# Patient Record
Sex: Female | Born: 1935 | Race: White | Hispanic: No | Smoking: Never smoker
Health system: Southern US, Community
[De-identification: ages and names within clinical notes are randomized; demographics above are authoritative.]

## PROBLEM LIST (undated history)

## (undated) DIAGNOSIS — M810 Age-related osteoporosis without current pathological fracture: Secondary | ICD-10-CM

## (undated) DIAGNOSIS — I10 Essential (primary) hypertension: Secondary | ICD-10-CM

## (undated) DIAGNOSIS — I509 Heart failure, unspecified: Secondary | ICD-10-CM

## (undated) DIAGNOSIS — M1990 Unspecified osteoarthritis, unspecified site: Secondary | ICD-10-CM

## (undated) DIAGNOSIS — F79 Unspecified intellectual disabilities: Secondary | ICD-10-CM

---

## 2003-05-21 ENCOUNTER — Encounter: Admission: RE | Admit: 2003-05-21 | Discharge: 2003-05-21 | Payer: Self-pay | Admitting: Internal Medicine

## 2010-06-09 ENCOUNTER — Emergency Department (HOSPITAL_COMMUNITY): Payer: Medicare Other

## 2010-06-09 ENCOUNTER — Observation Stay (HOSPITAL_COMMUNITY)
Admission: EM | Admit: 2010-06-09 | Discharge: 2010-06-11 | Disposition: A | Discharge status: Home / Self Care | Payer: Medicare Other | Attending: Internal Medicine | Admitting: Internal Medicine

## 2010-06-09 DIAGNOSIS — M810 Age-related osteoporosis without current pathological fracture: Secondary | ICD-10-CM | POA: Insufficient documentation

## 2010-06-09 DIAGNOSIS — I059 Rheumatic mitral valve disease, unspecified: Secondary | ICD-10-CM | POA: Insufficient documentation

## 2010-06-09 DIAGNOSIS — M1990 Unspecified osteoarthritis, unspecified site: Secondary | ICD-10-CM | POA: Insufficient documentation

## 2010-06-09 DIAGNOSIS — F70 Mild intellectual disabilities: Secondary | ICD-10-CM | POA: Insufficient documentation

## 2010-06-09 DIAGNOSIS — I517 Cardiomegaly: Secondary | ICD-10-CM | POA: Insufficient documentation

## 2010-06-09 DIAGNOSIS — R82998 Other abnormal findings in urine: Secondary | ICD-10-CM | POA: Insufficient documentation

## 2010-06-09 DIAGNOSIS — M7989 Other specified soft tissue disorders: Secondary | ICD-10-CM | POA: Insufficient documentation

## 2010-06-09 DIAGNOSIS — L03119 Cellulitis of unspecified part of limb: Secondary | ICD-10-CM | POA: Insufficient documentation

## 2010-06-09 DIAGNOSIS — R0602 Shortness of breath: Secondary | ICD-10-CM | POA: Insufficient documentation

## 2010-06-09 DIAGNOSIS — E876 Hypokalemia: Secondary | ICD-10-CM | POA: Insufficient documentation

## 2010-06-09 DIAGNOSIS — I10 Essential (primary) hypertension: Secondary | ICD-10-CM | POA: Insufficient documentation

## 2010-06-09 DIAGNOSIS — Z79899 Other long term (current) drug therapy: Secondary | ICD-10-CM | POA: Insufficient documentation

## 2010-06-09 DIAGNOSIS — L02219 Cutaneous abscess of trunk, unspecified: Secondary | ICD-10-CM | POA: Insufficient documentation

## 2010-06-09 DIAGNOSIS — L02419 Cutaneous abscess of limb, unspecified: Principal | ICD-10-CM | POA: Insufficient documentation

## 2010-06-09 LAB — DIFFERENTIAL
Basophils Relative: 1 % (ref 0–1)
Eosinophils Absolute: 0.1 10*3/uL (ref 0.0–0.7)
Monocytes Absolute: 0.7 10*3/uL (ref 0.1–1.0)
Monocytes Relative: 10 % (ref 3–12)
Neutrophils Relative %: 56 % (ref 43–77)

## 2010-06-09 LAB — COMPREHENSIVE METABOLIC PANEL
Alkaline Phosphatase: 64 U/L (ref 39–117)
BUN: 26 mg/dL — ABNORMAL HIGH (ref 6–23)
CO2: 27 mEq/L (ref 19–32)
Chloride: 101 mEq/L (ref 96–112)
Glucose, Bld: 90 mg/dL (ref 70–99)
Potassium: 3.5 mEq/L (ref 3.5–5.1)
Total Bilirubin: 0.7 mg/dL (ref 0.3–1.2)

## 2010-06-09 LAB — CBC
MCH: 31.2 pg (ref 26.0–34.0)
MCHC: 33.7 g/dL (ref 30.0–36.0)
Platelets: 263 10*3/uL (ref 150–400)

## 2010-06-09 LAB — CK TOTAL AND CKMB (NOT AT ARMC): Relative Index: INVALID (ref 0.0–2.5)

## 2010-06-10 ENCOUNTER — Observation Stay (HOSPITAL_COMMUNITY): Payer: Medicare Other

## 2010-06-10 DIAGNOSIS — I059 Rheumatic mitral valve disease, unspecified: Secondary | ICD-10-CM

## 2010-06-10 LAB — MRSA PCR SCREENING: MRSA by PCR: NEGATIVE

## 2010-06-10 LAB — MAGNESIUM: Magnesium: 2.3 mg/dL (ref 1.5–2.5)

## 2010-06-10 LAB — URINALYSIS, ROUTINE W REFLEX MICROSCOPIC
Ketones, ur: NEGATIVE mg/dL
Nitrite: NEGATIVE
Protein, ur: NEGATIVE mg/dL
Urobilinogen, UA: 0.2 mg/dL (ref 0.0–1.0)
pH: 6 (ref 5.0–8.0)

## 2010-06-10 LAB — CBC
MCH: 31 pg (ref 26.0–34.0)
Platelets: 240 10*3/uL (ref 150–400)
RBC: 4.03 MIL/uL (ref 3.87–5.11)
WBC: 5.2 10*3/uL (ref 4.0–10.5)

## 2010-06-10 LAB — BASIC METABOLIC PANEL
Chloride: 103 mEq/L (ref 96–112)
Creatinine, Ser: 0.78 mg/dL (ref 0.4–1.2)
GFR calc Af Amer: >60 mL/min (ref >60)
GFR calc non Af Amer: >60 mL/min (ref >60)

## 2010-06-10 LAB — DIFFERENTIAL
Basophils Relative: 1 % (ref 0–1)
Eosinophils Absolute: 0.2 10*3/uL (ref 0.0–0.7)
Monocytes Relative: 12 % (ref 3–12)
Neutrophils Relative %: 50 % (ref 43–77)

## 2010-06-10 LAB — GLUCOSE, CAPILLARY

## 2010-06-11 LAB — URINE CULTURE
Colony Count: NO GROWTH
Culture  Setup Time: 201205311820

## 2010-06-13 NOTE — H&P (Signed)
NAMESERRA, Lisa [name]               ACCOUNT NO.:  [id number]  MEDICAL RECORD NO.:  [id number]           PATIENT TYPE:  O  LOCATION:  A325                          FACILITY:  [facility]  PHYSICIAN:  Vania Rea, M.D. DATE OF BIRTH:  [birth year]  DATE OF ADMISSION:  06/09/2010 DATE OF DISCHARGE:  LH                             HISTORY & PHYSICAL   PRIMARY CARE PHYSICIAN:  Dr. Virgina Organ.  CHIEF COMPLAINT:  Swelling and redness of the leg since this morning.  HISTORY OF PRESENT ILLNESS:  This is an 77 year old Caucasian lady with a history of mental retardation, who resides at [facility] in [place], who usually bathes and dresses herself without assistance and typically wears pants, but who is occasionally inspected by the team at the Group Home to make sure everything is okay.  The patient reports that she has been having swelling and pain in her legs since this morning.  People in the Group Home said they think her legs have been swelling since last night, but did not become red and painful until this morning.  The patient does take Lasix daily for over a year now for lower extremity swelling, but does not have a history of CHF.  The swelling and pain in her feet were not associated with shortness of breath nor fever.  She has been having no nausea nor vomiting.  She has taken no over-the-counter medications and denies any itching of her skin.  The attendance at the Group Home are not aware of any periumbilical problems and the patient herself is not able to give any history in regard to periumbilical problem.  Other than this, history of presenting complaint is compromised by the patient's mental abnormality, and she refers all questions to her caregivers.  There is no history of trauma to the legs.  PAST MEDICAL HISTORY:  Hypertension, osteoarthritis, osteoporosis, mental retardation.  MEDICATIONS:  Include: 1. Furosemide 40 mg daily. 2. Amlodipine/benazepril 5/20 one  daily. 3. Alendronate 70 mg each Thursday. 4. Metoprolol tartrate 25 mg twice daily. 5. Calcium with vitamin D three times daily. 6. Tylenol Extra Strength 1 gram every 6 hours as needed for arthritic     pains. 7. Benefiber sugar free powder every day for constipation. 8. The patient also get an injection in her left knee every 6 weeks     from Dr. Hilda Lias for arthritis.  ALLERGIES:  To ADVIL, NAPROSYN, and ASPIRIN.  SOCIAL HISTORY:  No tobacco, alcohol, or illicit drug use.  Lives in a group home.  FAMILY HISTORY:  Unknown.  REVIEW OF SYSTEMS:  Other than noted above unremarkable.  PHYSICAL EXAM:  GENERAL:  Pleasant, elderly Caucasian lady, actually looks younger than her stated age. VITAL SIGNS:  Temperature is 99.9, pulse 82, respirations 20, blood pressure 154/72.  She is saturating 95% on room air. HEENT:  Her pupils are round and equal.  Mucous membranes pink. Anicteric.  No cervical lymphadenopathy.  No thyromegaly or carotid bruit. CHEST:  Clear to auscultation bilaterally. CARDIOVASCULAR SYSTEM:  Regular rhythm.  No murmur. ABDOMEN:  Soft, nontender.  No masses. LOWER EXTREMITIES:  She has 1+  soft pitting edema of both legs, arthritic deformity of the knees and ankles.  She has varicose veins in both feet and around the ankles. SKIN:  The skin is erythematous and warm in both legs and above the knees to the lower third of both thighs.  The skin of both buttocks was also red and blanches easily, but there is no ulcerations.  She has inflammation around the navel with a clear discharge oozing from the navel.  The inflammation and redness around the navel extends to an area about 3 cm circumference.  She has no other visible rash on her skin. She has no observed breakages of the skin of the extremities. CENTRAL NERVOUS SYSTEM:  Cranial nerves II-XII are grossly intact.  She has no focal lateralizing signs.  Her white count is 6.5, hemoglobin 13.4, platelets 263.  She  has normal differential.  Her eosinophil count is normal at 100.  Sodium is 138, potassium 3.5, chloride 101, CO2 27, glucose 90, BUN elevated at 26, creatinine 1.03.  Her liver functions are completely normal.  Her calcium is 10.3.  Her pro beta-type natriuretic peptide is just slightly elevated at 505.  Her cardiac enzymes are completely normal with undetectable troponins and CK of 56.  Her two-view chest x-ray shows slight COPD with mild linear basilar atelectasis, otherwise, no active cardiopulmonary disease.  ASSESSMENT: 1. Acute redness and swelling of the lower extremity.  Differential     includes acute early cellulitis versus an acute allergy. 2. Periumbilical rash, probably eczema. 3. Hypertension. 4. Osteoarthritis. 5. Mental retardation. 6. History of osteoporosis.  PLAN: 1. We will empirically start antibiotics.  We will choose Rocephin for     the time being.  The patient is rapidly spreading bacteria more     likely to be penicillin sensitive.  We will also start empiric     Benadryl for the next 10-24 hours to see if there is a rapid     response, but we will not give steroids. 2. Suspect her leg edema for which she chronically takes Lasix, is     related to her use of amlodipine.  We will not     give additional fluids for the time being, but we will hold her     Lasix.  I doubt that she has heart failure, but we will consider     empirically 2-D echo.  We will continue her other blood pressure     medications. 3. Other plans as per orders.     Vania Rea, M.D.     LC/MEDQ  D:  06/10/2010  T:  06/10/2010  Job:  [id number]  cc:   Colon Branch, MD Fax: [phone]  Electronically Signed by Vania Rea M.D. on 06/13/2010 04:11:29 AM

## 2010-06-13 NOTE — Discharge Summary (Signed)
NAMEHALLIE, [name]               ACCOUNT NO.:  [id number]  MEDICAL RECORD NO.:  [id number]           PATIENT TYPE:  O  LOCATION:  A325                          FACILITY:  [facility]  PHYSICIAN:  Elliot Cousin, M.D.    DATE OF BIRTH:  1923/05/26  DATE OF ADMISSION:  06/09/2010 DATE OF DISCHARGE:  06/01/2012LH                         DISCHARGE SUMMARY-REFERRING   DISCHARGE DIAGNOSES: 1. Bilateral lower extremity cellulitis with associated edema. 2. Periumbilical cellulitis. 3. Diastolic dysfunction per 2-D echocardiogram on Jun 10, 2010. 4. Osteoporosis. 5. Hypertension. 6. Degenerative joint disease. 7. Pyuria with no evidence of dysuria. 8. Mild mental retardation. 9. Borderline hypokalemia.  DISCHARGE MEDICATIONS: 1. Ceftin 500 mg b.i.d. for five more days. 2. Potassium chloride 20 mEq half a tablet b.i.d. 3. Alendronate 70 mg weekly on Thursdays. 4. Amlodipine/benazepril 5/20 mg daily. 5. Benefiber powder 1 packet daily. 6. Calcium carbonate with vitamin D one tablet three times daily. 7. Depo-Medrol 40 mg injection one injection in both knees every 6     weeks. 8. Furosemide 40 mg daily. 9. Metoprolol tartrate 50 mg half a tablet b.i.d. 10.Tylenol Extra Strength 500 mg two tablets every 6 hours as needed     for pain.  DISCHARGE DISPOSITION:  The patient was discharged to home in improved and stable condition on June 11, 2010.  She will follow up with her primary care physician, Dr. Virgina Organ in 1-2 weeks.  CONSULTATIONS:  None.  PROCEDURES PERFORMED: 1. Bilateral lower extremity venous duplex ultrasound on Jun 10, 2010.     The results revealed no evidence of deep vein thrombosis.  Limited     evaluation of calf veins. 2. A 2-D echocardiogram on Jun 10, 2010.  The results revealed     moderate focal basal hypertrophy of the left ventricle.  Systolic     function was vigorous.  Ejection fraction within the range of 75-     80%.  Wall motion was normal.  Doppler  parameters consistent with     grade 1 diastolic dysfunction.  Mild mitral valve regurgitation.     The left atrium was mildly to moderately dilated.  Trivial     tricuspid valve regurgitation.  Pulmonary artery peak pressure 30     mmHg.  HISTORY OF PRESENTING ILLNESS:  The patient is an 77 year old woman with a past medical history significant for mental retardation, osteoporosis, and hypertension, who presented from [facility] in [place], [place] with a chief complaint of swelling and redness of both legs.  When she was initially evaluated, she was noted to be afebrile and hemodynamically stable.  Her chest x-ray revealed slight COPD with mild linear bibasilar atelectasis, otherwise no active cardiopulmonary disease.  Her white blood cell count was within normal limits at 6.5. Her BNP was slightly elevated at 505.  She was admitted for further evaluation and management.  HOSPITAL COURSE:  The patient was continued on Lasix due to the bilateral lower extremity edema.  Rocephin was started empirically for treatment of cellulitis of both legs and at the periumbilical site. Lotrimin cream was added to the periumbilical site twice daily.  Most, if  not all of her other medications were continued.  Potassium chloride was added for borderline hypokalemia.  Her magnesium level was assessed. It was within normal limits at 2.3.  Her thyroid function was assessed. It was within normal limits at 2.33.  Her urinalysis revealed 3-6 wbc's and rare bacteria.  She had no complaints of pain with urination, although there was obvious mild pyuria.  The following day, Rocephin was discontinued in favor of Teflaro. Following the initiation of Teflaro, the cellulitis of her legs completely resolved.  The edema subsided substantially.  She still did have a trace of pedal edema at the time of discharge.  The patient herself voiced that there was significant improvement.  The periumbilical  area is still a little erythematous.  This area could be an area of eczema.  She was not sent home on a topical steroid or antifungal medication.  Rather, she needs to follow up with her primary care physician for reassessment on oral antibiotics only.  For further evaluation, a 2-D echocardiogram was ordered to rule out underlying congestive heart failure and bilateral lower extremity venous ultrasound was ordered to rule out DVT.  The 2-D echocardiogram revealed preserved LV function, although there was grade 1 diastolic dysfunction. The bilateral lower extremity venous ultrasound revealed no obvious DVT. As stated above, the edema in both of her legs decreased substantially.  The patient was discharged to home on five more days of Ceftin.  She was advised to keep her legs elevated at rest.  This was also discussed with [name] at the group home.  Ms. Milus Banister will make an appointment for the patient to follow up with Dr. Virgina Organ in 1-2 weeks for hospital followup.     Elliot Cousin, M.D.     DF/MEDQ  D:  06/11/2010  T:  06/11/2010  Job:  [id number]  cc:   Colon Branch, MD Fax: [phone]  Electronically Signed by Elliot Cousin M.D. on 06/13/2010 05:13:58 PM

## 2011-02-10 ENCOUNTER — Other Ambulatory Visit (HOSPITAL_COMMUNITY)
Admission: RE | Admit: 2011-02-10 | Discharge: 2011-02-10 | Disposition: A | Discharge status: Home / Self Care | Payer: Medicare Other | Source: Ambulatory Visit | Attending: Obstetrics & Gynecology | Admitting: Obstetrics & Gynecology

## 2011-02-10 ENCOUNTER — Other Ambulatory Visit: Payer: Self-pay | Admitting: Obstetrics & Gynecology

## 2011-02-10 DIAGNOSIS — Z124 Encounter for screening for malignant neoplasm of cervix: Secondary | ICD-10-CM | POA: Insufficient documentation

## 2011-09-13 ENCOUNTER — Ambulatory Visit: Payer: Medicare Other | Attending: Internal Medicine | Admitting: Rehabilitative and Restorative Service Providers"

## 2011-09-13 DIAGNOSIS — IMO0001 Reserved for inherently not codable concepts without codable children: Secondary | ICD-10-CM | POA: Insufficient documentation

## 2011-09-13 DIAGNOSIS — R269 Unspecified abnormalities of gait and mobility: Secondary | ICD-10-CM | POA: Insufficient documentation

## 2012-05-30 ENCOUNTER — Ambulatory Visit: Payer: Medicare Other | Attending: Internal Medicine | Admitting: Occupational Therapy

## 2012-05-30 DIAGNOSIS — IMO0001 Reserved for inherently not codable concepts without codable children: Secondary | ICD-10-CM | POA: Insufficient documentation

## 2012-05-30 DIAGNOSIS — R269 Unspecified abnormalities of gait and mobility: Secondary | ICD-10-CM | POA: Insufficient documentation

## 2012-06-15 ENCOUNTER — Ambulatory Visit: Payer: Medicare Other | Admitting: Rehabilitative and Restorative Service Providers"

## 2012-06-22 ENCOUNTER — Ambulatory Visit: Payer: Medicare Other | Attending: Internal Medicine | Admitting: Physical Therapy

## 2012-06-22 DIAGNOSIS — IMO0001 Reserved for inherently not codable concepts without codable children: Secondary | ICD-10-CM | POA: Insufficient documentation

## 2012-06-22 DIAGNOSIS — R269 Unspecified abnormalities of gait and mobility: Secondary | ICD-10-CM | POA: Insufficient documentation

## 2015-04-08 ENCOUNTER — Ambulatory Visit: Payer: Medicare Other | Admitting: Orthopaedic Surgery

## 2015-04-08 ENCOUNTER — Encounter: Payer: Self-pay | Admitting: Orthopaedic Surgery

## 2015-04-14 ENCOUNTER — Ambulatory Visit (INDEPENDENT_AMBULATORY_CARE_PROVIDER_SITE_OTHER): Payer: Medicare Other | Admitting: Orthopaedic Surgery

## 2015-04-14 DIAGNOSIS — M25562 Pain in left knee: Secondary | ICD-10-CM

## 2015-04-14 DIAGNOSIS — M25561 Pain in right knee: Secondary | ICD-10-CM

## 2015-04-14 NOTE — Progress Notes (Signed)
CC:  I have pain of my right and my left knee. I would like an injection.  The patient has chronic pain of the left and right knee.  There is no recent trauma.  There is no redness.  Injections in the past have helped.  The knee has no redness, has an effusion and crepitus present.  ROM of the knee is 0-100 right, 0-105 left.  Impression:  Chronic knee pain right and left knees  Return: 2 months.  PROCEDURE NOTE:  The patient request injection, verbal consent was obtained.  The bilateral knees were prepped appropriately after time out was performed.   Sterile technique was observed and injection of 1 cc of Depo-Medrol 40 mg with several cc's of plain xylocaine. Anesthesia was provided by ethyl chloride and a 20-gauge needle was used to inject the knee area. The injection was tolerated well. Both knees were injected the same way.  A band aid dressing was applied.  The patient was advised to apply ice later today and tomorrow to the injection sight as needed.  Call if any problem.

## 2015-06-16 ENCOUNTER — Ambulatory Visit (INDEPENDENT_AMBULATORY_CARE_PROVIDER_SITE_OTHER): Payer: Medicare Other | Admitting: Orthopaedic Surgery

## 2015-06-16 ENCOUNTER — Encounter: Payer: Self-pay | Admitting: Orthopaedic Surgery

## 2015-06-16 VITALS — BP 151/70 | HR 60 | Temp 97.1°F | Ht 59.75 in | Wt 106.4 lb

## 2015-06-16 DIAGNOSIS — M25561 Pain in right knee: Secondary | ICD-10-CM

## 2015-06-16 DIAGNOSIS — M25562 Pain in left knee: Secondary | ICD-10-CM | POA: Diagnosis not present

## 2015-06-16 NOTE — Progress Notes (Signed)
CC: Both of my knees are hurting. I would like an injection in both knees.  The patient has had chronic pain and tenderness of both knees for some time.  Injections help.  There is no locking or giving way of the knee.  There is no new trauma. There is no redness or signs of infections.  The knees have a mild effusion and some crepitus.  There is no redness or signs of recent trauma.  Impression:  Chronic pain of the both knees  Return:  2 months.  PROCEDURE NOTE:  The patient requests injections of the left knee , verbal consent was obtained.  The left knee was prepped appropriately after time out was performed.   Sterile technique was observed and injection of 1 cc of Depo-Medrol 40 mg with several cc's of plain xylocaine. Anesthesia was provided by ethyl chloride and a 20-gauge needle was used to inject the knee area. The injection was tolerated well.  A band aid dressing was applied.  The patient was advised to apply ice later today and tomorrow to the injection sight as needed.  PROCEDURE NOTE:  The patient requests injections of the right knee , verbal consent was obtained.  The right knee was prepped appropriately after time out was performed.   Sterile technique was observed and injection of 1 cc of Depo-Medrol 40 mg with several cc's of plain xylocaine. Anesthesia was provided by ethyl chloride and a 20-gauge needle was used to inject the knee area. The injection was tolerated well.  A band aid dressing was applied.  The patient was advised to apply ice later today and tomorrow to the injection sight as needed.  Electronically Signed Darreld McleanWayne Dakayla Disanti, MD 6/6/201710:20 AM

## 2015-08-18 ENCOUNTER — Ambulatory Visit: Payer: Medicare Other | Admitting: Orthopaedic Surgery

## 2015-08-18 ENCOUNTER — Encounter: Payer: Self-pay | Admitting: Orthopaedic Surgery

## 2015-12-09 ENCOUNTER — Ambulatory Visit (INDEPENDENT_AMBULATORY_CARE_PROVIDER_SITE_OTHER): Payer: Medicare Other | Admitting: Orthopaedic Surgery

## 2015-12-09 VITALS — Ht 59.75 in

## 2015-12-09 DIAGNOSIS — M25562 Pain in left knee: Secondary | ICD-10-CM | POA: Diagnosis not present

## 2015-12-09 DIAGNOSIS — G8929 Other chronic pain: Secondary | ICD-10-CM | POA: Diagnosis not present

## 2015-12-09 DIAGNOSIS — M25561 Pain in right knee: Secondary | ICD-10-CM

## 2015-12-09 NOTE — Progress Notes (Signed)
CC: Both of my knees are hurting. I would like an injection in both knees.  The patient has had chronic pain and tenderness of both knees for some time.  Injections help.  There is no locking or giving way of the knee.  There is no new trauma. There is no redness or signs of infections.  The knees have a mild effusion and some crepitus.  There is no redness or signs of recent trauma.  Right knee ROM is 0-105 and left knee ROM is 0-100.  Impression:  Chronic pain of the both knees  Return:  2 months  PROCEDURE NOTE:  The patient requests injections of both knees, verbal consent was obtained.  The left and right knee were individually prepped appropriately after time out was performed.   Sterile technique was observed and injection of 1 cc of Depo-Medrol 40 mg with several cc's of plain xylocaine. Anesthesia was provided by ethyl chloride and a 20-gauge needle was used to inject each knee area. The injections were tolerated well.  A band aid dressing was applied.  The patient was advised to apply ice later today and tomorrow to the injection sight as needed.   Electronically Signed Darreld McleanWayne Ras Kollman, MD 11/29/201710:25 AM

## 2016-02-09 ENCOUNTER — Ambulatory Visit: Payer: Medicare Other | Admitting: Orthopaedic Surgery

## 2016-02-09 ENCOUNTER — Encounter: Payer: Self-pay | Admitting: Orthopaedic Surgery

## 2016-04-20 ENCOUNTER — Ambulatory Visit (INDEPENDENT_AMBULATORY_CARE_PROVIDER_SITE_OTHER): Payer: Medicare Other | Admitting: Orthopaedic Surgery

## 2016-04-20 ENCOUNTER — Encounter: Payer: Self-pay | Admitting: Orthopaedic Surgery

## 2016-04-20 DIAGNOSIS — G8929 Other chronic pain: Secondary | ICD-10-CM | POA: Diagnosis not present

## 2016-04-20 DIAGNOSIS — M25562 Pain in left knee: Secondary | ICD-10-CM

## 2016-04-20 DIAGNOSIS — M25561 Pain in right knee: Secondary | ICD-10-CM

## 2016-04-20 NOTE — Progress Notes (Signed)
CC: Both of my knees are hurting. I would like an injection in both knees.  The patient has had chronic pain and tenderness of both knees for some time.  Injections help.  There is no locking or giving way of the knee.  There is no new trauma. There is no redness or signs of infections.  The knees have a mild effusion and some crepitus.  There is no redness or signs of recent trauma.  Right knee ROM is 0-100 and left knee ROM is 0-95.  Impression:  Chronic pain of the both knees  Return:  2 months  PROCEDURE NOTE:  The patient requests injections of both knees, verbal consent was obtained.  The left and right knee were individually prepped appropriately after time out was performed.   Sterile technique was observed and injection of 1 cc of Depo-Medrol 40 mg with several cc's of plain xylocaine. Anesthesia was provided by ethyl chloride and a 20-gauge needle was used to inject each knee area. The injections were tolerated well.  A band aid dressing was applied.  The patient was advised to apply ice later today and tomorrow to the injection sight as needed.   Electronically Signed Darreld Mclean, MD 4/11/20188:25 AM

## 2016-06-21 ENCOUNTER — Ambulatory Visit: Payer: Medicare Other | Admitting: Orthopaedic Surgery

## 2016-06-29 ENCOUNTER — Encounter: Payer: Self-pay | Admitting: Orthopaedic Surgery

## 2016-06-29 ENCOUNTER — Ambulatory Visit (INDEPENDENT_AMBULATORY_CARE_PROVIDER_SITE_OTHER): Payer: Medicare Other | Admitting: Orthopaedic Surgery

## 2016-06-29 DIAGNOSIS — M25562 Pain in left knee: Secondary | ICD-10-CM

## 2016-06-29 DIAGNOSIS — M25561 Pain in right knee: Secondary | ICD-10-CM

## 2016-06-29 DIAGNOSIS — G8929 Other chronic pain: Secondary | ICD-10-CM | POA: Diagnosis not present

## 2016-06-29 NOTE — Progress Notes (Signed)
CC: Both of my knees are hurting. I would like an injection in both knees.  The patient has had chronic pain and tenderness of both knees for some time.  Injections help.  There is no locking or giving way of the knee.  There is no new trauma. There is no redness or signs of infections.  The knees have a mild effusion and some crepitus.  There is no redness or signs of recent trauma.  Right knee ROM is 0-105 and left knee ROM is 0-100.  Impression:  Chronic pain of the both knees  Return:  2 months  PROCEDURE NOTE:  The patient requests injections of both knees, verbal consent was obtained.  The left and right knee were individually prepped appropriately after time out was performed.   Sterile technique was observed and injection of 1 cc of Depo-Medrol 40 mg with several cc's of plain xylocaine. Anesthesia was provided by ethyl chloride and a 20-gauge needle was used to inject each knee area. The injections were tolerated well.  A band aid dressing was applied.  The patient was advised to apply ice later today and tomorrow to the injection sight as needed.  Electronically Signed Darreld McleanWayne Hashir Deleeuw, MD 6/20/201810:13 AM

## 2016-08-31 ENCOUNTER — Ambulatory Visit (INDEPENDENT_AMBULATORY_CARE_PROVIDER_SITE_OTHER): Payer: Medicare Other | Admitting: Orthopaedic Surgery

## 2016-08-31 DIAGNOSIS — G8929 Other chronic pain: Secondary | ICD-10-CM | POA: Diagnosis not present

## 2016-08-31 DIAGNOSIS — M25561 Pain in right knee: Secondary | ICD-10-CM

## 2016-08-31 DIAGNOSIS — M25562 Pain in left knee: Secondary | ICD-10-CM | POA: Diagnosis not present

## 2016-08-31 NOTE — Progress Notes (Signed)
CC: Both of my knees are hurting. I would like an injection in both knees.  The patient has had chronic pain and tenderness of both knees for some time.  Injections help.  There is no locking or giving way of the knee.  There is no new trauma. There is no redness or signs of infections.  The knees have a mild effusion and some crepitus.  There is no redness or signs of recent trauma.  Right knee ROM is 0-100 and left knee ROM is 0-95.  Impression:  Chronic pain of the both knees  Return:  2 months  PROCEDURE NOTE:  The patient requests injections of both knees, verbal consent was obtained.  The left and right knee were individually prepped appropriately after time out was performed.   Sterile technique was observed and injection of 1 cc of Depo-Medrol 40 mg with several cc's of plain xylocaine. Anesthesia was provided by ethyl chloride and a 20-gauge needle was used to inject each knee area. The injections were tolerated well.  A band aid dressing was applied.  The patient was advised to apply ice later today and tomorrow to the injection sight as needed.  Today [birth year] birthday.  Electronically Signed Darreld Mclean, MD 8/22/201811:21 AM

## 2016-09-16 ENCOUNTER — Emergency Department (HOSPITAL_COMMUNITY)
Admission: EM | Admit: 2016-09-16 | Discharge: 2016-09-16 | Disposition: A | Discharge status: Home / Self Care | Payer: Medicare Other | Source: Home / Self Care | Attending: Emergency Medicine | Admitting: Emergency Medicine

## 2016-09-16 ENCOUNTER — Emergency Department (HOSPITAL_COMMUNITY): Payer: Medicare Other

## 2016-09-16 ENCOUNTER — Inpatient Hospital Stay (HOSPITAL_COMMUNITY)
Admission: EM | Admit: 2016-09-16 | Discharge: 2016-09-22 | DRG: 446 | Disposition: A | Discharge status: Other Acute Inpatient Hospital | Payer: Medicare Other | Attending: Internal Medicine | Admitting: Internal Medicine

## 2016-09-16 ENCOUNTER — Encounter (HOSPITAL_COMMUNITY): Payer: Self-pay

## 2016-09-16 ENCOUNTER — Encounter (HOSPITAL_COMMUNITY): Payer: Self-pay | Admitting: Emergency Medicine

## 2016-09-16 DIAGNOSIS — I110 Hypertensive heart disease with heart failure: Secondary | ICD-10-CM | POA: Diagnosis present

## 2016-09-16 DIAGNOSIS — Z79899 Other long term (current) drug therapy: Secondary | ICD-10-CM

## 2016-09-16 DIAGNOSIS — R627 Adult failure to thrive: Secondary | ICD-10-CM | POA: Diagnosis present

## 2016-09-16 DIAGNOSIS — L8990 Pressure ulcer of unspecified site, unspecified stage: Secondary | ICD-10-CM | POA: Insufficient documentation

## 2016-09-16 DIAGNOSIS — M810 Age-related osteoporosis without current pathological fracture: Secondary | ICD-10-CM | POA: Diagnosis present

## 2016-09-16 DIAGNOSIS — R1312 Dysphagia, oropharyngeal phase: Secondary | ICD-10-CM | POA: Diagnosis present

## 2016-09-16 DIAGNOSIS — I509 Heart failure, unspecified: Secondary | ICD-10-CM

## 2016-09-16 DIAGNOSIS — R1030 Lower abdominal pain, unspecified: Secondary | ICD-10-CM

## 2016-09-16 DIAGNOSIS — R7989 Other specified abnormal findings of blood chemistry: Secondary | ICD-10-CM | POA: Diagnosis present

## 2016-09-16 DIAGNOSIS — R531 Weakness: Secondary | ICD-10-CM | POA: Diagnosis not present

## 2016-09-16 DIAGNOSIS — R17 Unspecified jaundice: Secondary | ICD-10-CM

## 2016-09-16 DIAGNOSIS — R296 Repeated falls: Secondary | ICD-10-CM | POA: Diagnosis present

## 2016-09-16 DIAGNOSIS — W06XXXA Fall from bed, initial encounter: Secondary | ICD-10-CM | POA: Diagnosis present

## 2016-09-16 DIAGNOSIS — R740 Nonspecific elevation of levels of transaminase and lactic acid dehydrogenase [LDH]: Secondary | ICD-10-CM

## 2016-09-16 DIAGNOSIS — K831 Obstruction of bile duct: Principal | ICD-10-CM | POA: Diagnosis present

## 2016-09-16 DIAGNOSIS — R52 Pain, unspecified: Secondary | ICD-10-CM

## 2016-09-16 DIAGNOSIS — Z886 Allergy status to analgesic agent status: Secondary | ICD-10-CM

## 2016-09-16 DIAGNOSIS — W19XXXA Unspecified fall, initial encounter: Secondary | ICD-10-CM

## 2016-09-16 DIAGNOSIS — R109 Unspecified abdominal pain: Secondary | ICD-10-CM | POA: Diagnosis present

## 2016-09-16 DIAGNOSIS — R945 Abnormal results of liver function studies: Secondary | ICD-10-CM

## 2016-09-16 DIAGNOSIS — F79 Unspecified intellectual disabilities: Secondary | ICD-10-CM

## 2016-09-16 DIAGNOSIS — R7401 Elevation of levels of liver transaminase levels: Secondary | ICD-10-CM

## 2016-09-16 HISTORY — DX: Unspecified intellectual disabilities: F79

## 2016-09-16 HISTORY — DX: Age-related osteoporosis without current pathological fracture: M81.0

## 2016-09-16 HISTORY — DX: Unspecified osteoarthritis, unspecified site: M19.90

## 2016-09-16 HISTORY — DX: Heart failure, unspecified: I50.9

## 2016-09-16 HISTORY — DX: Essential (primary) hypertension: I10

## 2016-09-16 LAB — TROPONIN I

## 2016-09-16 LAB — COMPREHENSIVE METABOLIC PANEL
ALBUMIN: 3.1 g/dL — AB (ref 3.5–5.0)
ALT: 817 U/L — AB (ref 14–54)
ANION GAP: 9 (ref 5–15)
AST: 464 U/L — ABNORMAL HIGH (ref 15–41)
Alkaline Phosphatase: 192 U/L — ABNORMAL HIGH (ref 38–126)
BUN: 22 mg/dL — ABNORMAL HIGH (ref 6–20)
CHLORIDE: 100 mmol/L — AB (ref 101–111)
CO2: 21 mmol/L — AB (ref 22–32)
CREATININE: 0.99 mg/dL (ref 0.44–1.00)
Calcium: 8.5 mg/dL — ABNORMAL LOW (ref 8.9–10.3)
GFR calc non Af Amer: 48 mL/min — ABNORMAL LOW (ref >60)
GFR, EST AFRICAN AMERICAN: 56 mL/min — AB (ref >60)
GLUCOSE: 138 mg/dL — AB (ref 65–99)
Potassium: 4.5 mmol/L (ref 3.5–5.1)
SODIUM: 130 mmol/L — AB (ref 135–145)
Total Bilirubin: 5.8 mg/dL — ABNORMAL HIGH (ref 0.3–1.2)
Total Protein: 5.1 g/dL — ABNORMAL LOW (ref 6.5–8.1)

## 2016-09-16 LAB — URINALYSIS, ROUTINE W REFLEX MICROSCOPIC
Bilirubin Urine: NEGATIVE
GLUCOSE, UA: NEGATIVE mg/dL
Hgb urine dipstick: NEGATIVE
KETONES UR: NEGATIVE mg/dL
LEUKOCYTES UA: NEGATIVE
NITRITE: NEGATIVE
PROTEIN: NEGATIVE mg/dL
Specific Gravity, Urine: 1.005 (ref 1.005–1.030)
pH: 6 (ref 5.0–8.0)

## 2016-09-16 LAB — CBC
HEMATOCRIT: 39.6 % (ref 36.0–46.0)
HEMOGLOBIN: 14.2 g/dL (ref 12.0–15.0)
MCH: 32.7 pg (ref 26.0–34.0)
MCHC: 35.9 g/dL (ref 30.0–36.0)
MCV: 91.2 fL (ref 78.0–100.0)
Platelets: 234 10*3/uL (ref 150–400)
RBC: 4.34 MIL/uL (ref 3.87–5.11)
RDW: 13.5 % (ref 11.5–15.5)
WBC: 7.8 10*3/uL (ref 4.0–10.5)

## 2016-09-16 MED ORDER — SODIUM CHLORIDE 0.9 % IV BOLUS (SEPSIS)
1000.0000 mL | Freq: Once | INTRAVENOUS | Status: AC
  Administered 2016-09-16: 1000 mL via INTRAVENOUS

## 2016-09-16 MED ORDER — IOPAMIDOL (ISOVUE-300) INJECTION 61%
75.0000 mL | Freq: Once | INTRAVENOUS | Status: AC | PRN
  Administered 2016-09-16: 75 mL via INTRAVENOUS

## 2016-09-16 MED ORDER — PIPERACILLIN-TAZOBACTAM 3.375 G IVPB 30 MIN
3.3750 g | Freq: Once | INTRAVENOUS | Status: AC
  Administered 2016-09-16: 3.375 g via INTRAVENOUS
  Filled 2016-09-16: qty 50

## 2016-09-16 MED ORDER — SODIUM CHLORIDE 0.9 % IV BOLUS (SEPSIS)
1000.0000 mL | Freq: Once | INTRAVENOUS | Status: AC
  Administered 2016-09-17: 1000 mL via INTRAVENOUS

## 2016-09-16 NOTE — ED Notes (Signed)
Pt currently getting CT and xrays

## 2016-09-16 NOTE — ED Provider Notes (Signed)
[facility]-EMERGENCY DEPT Provider Note   CSN: [id number] Arrival date & time: [id number]/7/18  0930     History   Chief Complaint Chief Complaint  Patient presents with  . poor po intake    HPI Lisa [name] is a 83 y.o. female.  Lisa [name] is a 83 y.o. Female with a history of CHF, hypertension, osteoporosis and mild MR, who is brought via EMS from her group home, EMS reports patient having eaten in 2 days and patient fell last night patient is currently denying pain. Patient reports she was having some lower abdominal pain and did not feel like eating but reports this has improved and she was able to eat some chicken noodle soup last night and applesauce this morning. Patient denies abdominal pain currently, no associated nausea, vomiting or diarrhea. No previous abdominal surgeries. Patient reports she fell while trying to close her door this morning but her roommate caught her, she denies hitting her head, denies pain or injury anywhere. Patient is not currently on blood thinners.  Representative from group home is present and confirms that she stumbled while using her walking but was caught my staff and did not sustain any obvious injury, no head trauma or loss of consciousness, she mentioned abdominal pain and that is why they brought her in.      Past Medical History:  Diagnosis Date  . CHF (congestive heart failure) (HCC)   . DJD (degenerative joint disease)   . Hypertension   . Mental retardation    mild  . Osteoporosis     There are no active problems to display for this patient.   History reviewed. No pertinent surgical history.  OB History    No data available       Home Medications    Prior to Admission medications   Medication Sig Start Date End Date Taking? Authorizing Provider  amLODipine-benazepril (LOTREL) 5-20 MG capsule  06/01/15   [provider]  furosemide (LASIX) 40 MG tablet  06/01/15   [provider]  Guar Gum (NUTRISOURCE FIBER)  POWD Take by mouth.    [provider]  metoprolol tartrate (LOPRESSOR) 25 MG tablet  06/01/15   [provider]  Multiple Vitamins-Minerals (PRESERVISION/LUTEIN PO) Take by mouth.    [provider]  Ethelda Chickyster Shell (OYSTER CALCIUM) 500 MG TABS tablet Take 500 mg of elemental calcium by mouth 2 (two) times daily.    [provider]  pantoprazole (PROTONIX) 40 MG tablet  06/01/15   [provider]    Family History No family history on file.  Social History Social History  Substance Use Topics  . Smoking status: Never Smoker  . Smokeless tobacco: Not on file  . Alcohol use No     Allergies   Advil [ibuprofen]; Aspirin; and Naprosyn [naproxen]   Review of Systems Review of Systems  Constitutional: Negative for chills and fever.  HENT: Negative for congestion, ear pain, rhinorrhea and sore throat.   Eyes: Negative for photophobia and visual disturbance.  Respiratory: Negative for cough, chest tightness and shortness of breath.   Cardiovascular: Negative for chest pain, palpitations and leg swelling.  Gastrointestinal: Positive for abdominal pain. Negative for diarrhea, nausea and vomiting.  Genitourinary: Negative for difficulty urinating and dysuria.  Musculoskeletal: Negative for arthralgias, back pain and myalgias.  Skin: Negative for pallor and rash.  Neurological: Negative for dizziness, facial asymmetry, weakness, light-headedness, numbness and headaches.     Physical Exam Updated Vital Signs BP (!) 129/59 (BP Location:  Left Arm)   Pulse 72   Temp 98.7 F (37.1 C) (Oral)   Resp 18   SpO2 99%   Physical Exam  Constitutional: She appears well-developed and well-nourished. No distress.  HENT:  Head: Normocephalic and atraumatic.  No evidence of head trauma, no scalp ecchymosis, nontender to palpation, no battle sign, no hemotympanum  Eyes: Pupils are equal, round, and reactive to light. EOM are normal. Right eye exhibits no  discharge. Left eye exhibits no discharge.  Cardiovascular: Normal rate, regular rhythm, normal heart sounds and intact distal pulses.   Pulmonary/Chest: Effort normal and breath sounds normal. No respiratory distress.  Abdominal: Soft. Bowel sounds are normal. She exhibits no distension and no mass. There is no tenderness. There is no guarding.  Abdomen is NTTP in all quadrants, no evidence of guarding or rebound tenderness, negative murphy's, no tenderness at Mcburney's point, no masses  Musculoskeletal: She exhibits no edema or deformity.  All joints are supple, nontender palpation, easily  movable and all compartments are soft, nonspecific bruising to right lower extremity but nontender and no deformity, no other evidence of ecchymosis  Neurological: She is alert. Coordination normal.  Speech is clear, able to follow commands CN III-XII intact Normal strength in upper and lower extremities bilaterally including dorsiflexion and plantar flexion, strong and equal grip strength Sensation normal to light and sharp touch Moves extremities without ataxia, coordination intact  Skin: Skin is warm and dry. Capillary refill takes less than 2 seconds. She is not diaphoretic.  Psychiatric: She has a normal mood and affect. Her behavior is normal.  Nursing note and vitals reviewed.    ED Treatments / Results  Labs (all labs ordered are listed, but only abnormal results are displayed) Labs Reviewed  URINALYSIS, ROUTINE W REFLEX MICROSCOPIC  CBC  TROPONIN I    EKG  EKG Interpretation  Date/Time:  Friday September 16 2016 09:37:30 EDT Ventricular Rate:  69 PR Interval:    QRS Duration: 90 QT Interval:  389 QTC Calculation: 417 R Axis:   74 Text Interpretation:  Sinus rhythm RSR' in V1 or V2, right VCD or RVH Nonspecific ST abnormality Abnormal ekg Since last tracing ST segments seem changed and abnormal c/w 2010/06/09 Confirmed by Eber Hong ([id number]) on 09/16/2016 10:00:41 AM        Radiology No results found.  Procedures Procedures (including critical care time)  Medications Ordered in ED Medications - No data to display   Initial Impression / Assessment and Plan / ED Course  I have reviewed the triage vital signs and the nursing notes.  Pertinent labs & imaging results that were available during my care of the patient were reviewed by me and considered in my medical decision making (see chart for details).  Patient presents after stumbling a group home and endorsing abdominal pain, vitals are normal on initial eval, patient reports abdominal pain is improved. No concerning findings on abdominal exam. Patient denies hitting her head and does not show any signs of trauma, spoke with group home staff who confirmed the patient did not fall to the ground and was caught by staff.   EKG with some flattened ST segments, no other ischemic changes, patient denies chest pain, will check troponin. Will check CBC and UA.   Labs unremarkable, no evidence of infection, UA is clear, will give PO trial. Patient able to drink water and ate graham crackers without issues, on re-eva abdomen continues to be non-tender, denies nausea. No evidence of injury from fall,  did not feel imaging would be necessary at this time. Patient will be discharged home with follow-up with her PCP, return precautions provided. Patient and group home representative express understanding and are in agreement with plan.  Patient discussed with Dr. Hyacinth Meeker, who saw patient as well and agrees with plan.   Final Clinical Impressions(s) / ED Diagnoses   Final diagnoses:  Lower abdominal pain  Fall, initial encounter    New Prescriptions Discharge Medication List as of 09/16/2016  1:43 PM       Dartha Lodge, PA-C 09/18/16 1156    Eber Hong, MD 09/18/16 [phone]

## 2016-09-16 NOTE — Discharge Instructions (Signed)
Your workup is reassuring, no evidence of injury from fall and labs were normal. If abdominal pain returns, he developed fever or chills, nausea or vomiting please return to the ED for reevaluation. Otherwise follow-up with your primary care provider.

## 2016-09-16 NOTE — ED Triage Notes (Signed)
Pt sent to ED from [facility] for fall. Pt states she slid off the foot of the bed while trying to put on her shoes. Denies pain. Pt seen in ED this am for fall this am as well. nad noted.

## 2016-09-16 NOTE — ED Notes (Signed)
Spoke to [facility]. They are sending out a representative to sit with patient and help answer any questions and assist with patient.

## 2016-09-16 NOTE — ED Notes (Signed)
Graham crackers and water offered to patient. Patient tolerating food and PO fluid.

## 2016-09-16 NOTE — ED Provider Notes (Signed)
Medical screening examination/treatment/procedure(s) were conducted as a shared visit with non-physician practitioner(s) and myself.  I personally evaluated the patient during the encounter.  Clinical Impression:   Final diagnoses:  Lower abdominal pain  Fall, initial encounter    The patient is an elderly 83 year old female, comes from her group home after she had a reported fall though she states that she lost her balance and was helped to the ground with her roommate, she had eaten some chicken noodle soup last night, the report was that she might not of had much to eat in the last 2 days. The patient denies any complaints at all.  On exam she does have some vague bruising around the right lower extremity but has no tenderness, no deformity and supple joints with soft compartments diffusely. Nontender abdomen, nontender spine, no signs of head injury without any hemotympanum, malocclusion, raccoon eyes or battle sign. Mucous membranes appear normal, there is no chest tenderness, clear heart and lung sounds, again a nontender abdomen.  The patient does not have any signs of injury from the fall. We'll check a urinalysis, by mouth trial, vital signs are reassuring.    EKG Interpretation  Date/Time:  Friday September 16 2016 09:37:30 EDT Ventricular Rate:  69 PR Interval:    QRS Duration: 90 QT Interval:  389 QTC Calculation: 417 R Axis:   74 Text Interpretation:  Sinus rhythm RSR' in V1 or V2, right VCD or RVH Nonspecific ST abnormality Abnormal ekg Since last tracing ST segments seem changed and abnormal c/w 2010/06/09 Confirmed by Eber HongMiller, Rhylen Pulido ([id number]) on 09/16/2016 10:00:41 AM        Medical screening examination/treatment/procedure(s) were conducted as a shared visit with non-physician practitioner(s) and myself.  I personally evaluated the patient during the encounter.  Clinical Impression:   Final diagnoses:  Lower abdominal pain  Fall, initial encounter         Eber HongMiller,  Izabel Chim, MD 09/17/16 [phone]

## 2016-09-16 NOTE — ED Notes (Signed)
Pt complaining of no pain anywhere

## 2016-09-16 NOTE — ED Provider Notes (Signed)
Emergency Department Provider Note   I have reviewed the triage vital signs and the nursing notes.   HISTORY  Chief Complaint Fall   HPI Lisa [name] is a 83 y.o. female who is a poor historian but presents to the emergency Department from what sounds like a 2 foot fall versus a slightly ground. EMS stated the patient had been bending over trying tie her shoe and slid down on the floor and needed help up. On EMS arrival the patient was on the edge of the bed and slumped over. Vital signs are normal. Patient able to ambulate without too much difficulty and was brought here for further evaluation. Patient has no complaints this time.   Past Medical History:  Diagnosis Date  . CHF (congestive heart failure) (HCC)   . DJD (degenerative joint disease)   . Hypertension   . Mental retardation    mild  . Osteoporosis     There are no active problems to display for this patient.   History reviewed. No pertinent surgical history.  Current Outpatient Rx  . Order #: [id number] Class: Historical Med  . Order #: [id number] Class: Historical Med  . Order #: [id number] Class: Historical Med  . Order #: [id number] Class: Historical Med  . Order #: [id number] Class: Historical Med  . Order #: [id number] Class: Historical Med    Allergies Advil [ibuprofen]; Aspirin; and Naprosyn [naproxen]  No family history on file.  Social History Social History  Substance Use Topics  . Smoking status: Never Smoker  . Smokeless tobacco: Never Used  . Alcohol use No    Review of Systems  All other systems negative except as documented in the HPI. All pertinent positives and negatives as reviewed in the HPI. ____________________________________________  PHYSICAL EXAM:  VITAL SIGNS: ED Triage Vitals  Enc Vitals Group     BP 09/16/16 1830 (!) 155/57     Pulse Rate 09/16/16 1830 92     Resp 09/16/16 1830 16     Temp 09/16/16 1830 99.2 F (37.3 C)     Temp src --      SpO2 09/16/16 1830 96 %   Weight 09/16/16 1828 106 lb (48.1 kg)     Height 09/16/16 1828  (1.626 m)     Head Circumference --      Peak Flow --      Pain Score --      Pain Loc --      Pain Edu? --      Excl. in GC? --     Constitutional: Alert and oriented. Well appearing and in no acute distress. Eyes: Conjunctivae are normal. PERRL. EOMI. Head: Atraumatic. Nose: No congestion/rhinnorhea. Mouth/Throat: Mucous membranes are moist.  Oropharynx non-erythematous. Neck: No stridor.  No meningeal signs.   Cardiovascular: Normal rate, regular rhythm. Good peripheral circulation. Grossly normal heart sounds.   Respiratory: Normal respiratory effort.  No retractions. Lungs CTAB. Gastrointestinal: Soft and nontender. No distention.  Musculoskeletal: No gross deformities of extremities. Does have ecchymosis over the right lower leg and is tender. Mild pain with AP and lateral compression of her hips.does not remember the fall very clearly been no trauma to her head is noticed. Neurologic:  Normal speech and language. No gross focal neurologic deficits are appreciated.  Skin:  Skin is warm, dry and intact. No rash noted.  ____________________________________________   LABS (all labs ordered are listed, but only abnormal results are displayed)  Labs Reviewed  COMPREHENSIVE METABOLIC PANEL - Abnormal; Notable for the following:  Result Value   Sodium 130 (*)    Chloride 100 (*)    CO2 21 (*)    Glucose, Bld 138 (*)    BUN 22 (*)    Calcium 8.5 (*)    Total Protein 5.1 (*)    Albumin 3.1 (*)    AST 464 (*)    ALT 817 (*)    Alkaline Phosphatase 192 (*)    Total Bilirubin 5.8 (*)    GFR calc non Af Amer 48 (*)    GFR calc Af Amer 56 (*)    All other components within normal limits  ACETAMINOPHEN LEVEL - Abnormal; Notable for the following:    Acetaminophen (Tylenol), Serum <10 (*)    All other components within normal limits  LIPASE, BLOOD - Abnormal; Notable for the following:    Lipase 95 (*)      All other components within normal limits  HEPATITIS PANEL, ACUTE   ____________________________________________  RADIOLOGY  Dg Pelvis 1-2 Views  Result Date: 09/16/2016 CLINICAL DATA:  Fall EXAM: PELVIS - 1-2 VIEW COMPARISON:  None. FINDINGS: Osteopenia. SI joint arthritis. Pubic symphysis and rami appear intact. Vascular calcifications. Advanced arthritis at the right hip with chronic deformity of the right femoral head. Possible step-off deformity at the right femoral head neck junction. Moderate degenerative changes of the left hip IMPRESSION: 1. Advanced arthritis of the right hip with chronic deformity of the right femoral head. Questionable step-off deformity at the right femoral head neck junction, suggest CT for further evaluation. Electronically Signed   By: Jasmine Pang M.D.   On: 09/16/2016 19:39   Dg Tibia/fibula Right  Result Date: 09/16/2016 CLINICAL DATA:  Initial evaluation for acute trauma, fall. EXAM: RIGHT TIBIA AND FIBULA - 2 VIEW COMPARISON:  None. FINDINGS: No acute fracture or dislocation. Bones are diffusely osteopenic. Degenerative changes noted about the knee and ankle. No acute soft tissue abnormality. Prominent vascular calcifications. IMPRESSION: No acute osseous abnormality about the right tibia/fibula. Electronically Signed   By: Rise Mu M.D.   On: 09/16/2016 19:55   Ct Head Wo Contrast  Result Date: 09/16/2016 CLINICAL DATA:  Head trauma. Larey Seat out of bed last night. Lightheadedness and dizziness. Initial encounter. EXAM: CT HEAD WITHOUT CONTRAST TECHNIQUE: Contiguous axial images were obtained from the base of the skull through the vertex without intravenous contrast. COMPARISON:  None. FINDINGS: Brain: A 4 mm hyperdense focus in the right corona radiata has attenuation consistent with calcification. There is no evidence of acute cortical infarct, intracranial hemorrhage, midline shift, or extra-axial fluid collection. Mild generalized cerebral atrophy  is within normal limits for age. Periventricular white matter hypoattenuation is nonspecific but compatible with mild chronic small vessel ischemic disease. Lacunar infarcts are present in the basal ganglia bilaterally. There is an 8 mm focus of calcification over the left parietal convexity without mass effect on the underlying brain, possibly a calcified meningioma and not felt to be of any clinical significance. Vascular: Calcified atherosclerosis at the skullbase. No hyperdense vessel. Skull: No fracture. Sclerosis in the right greater sphenoid wing without aggressive features, possibly fibrous dysplasia. Sinuses/Orbits: Minimal scattered paranasal sinus mucosal thickening clear mastoid air cells. Bilateral cataract extraction. Other: None. IMPRESSION: 1. No evidence of acute intracranial hemorrhage. 2. Age indeterminate lacunar infarcts in the basal ganglia bilaterally. 3. Mild chronic small vessel ischemic disease. 4. Possible 8 mm left parietal meningioma. Electronically Signed   By: Sebastian Ache M.D.   On: 09/16/2016 19:50   Ct Abdomen Pelvis W  Contrast  Result Date: 09/17/2016 CLINICAL DATA:  Patient fell off of the head. Patient was seen this morning for a fall as well. Unspecified abdominal pain. EXAM: CT ABDOMEN AND PELVIS WITH CONTRAST TECHNIQUE: Multidetector CT imaging of the abdomen and pelvis was performed using the standard protocol following bolus administration of intravenous contrast. CONTRAST:  75mL ISOVUE-300 IOPAMIDOL (ISOVUE-300) INJECTION 61% COMPARISON:  CT right hip 09/16/2016 FINDINGS: Lower chest: Motion artifact limits evaluation. Probable atelectasis and scarring in the lung bases. Calcification of the aorta, mitral valve, and coronary arteries. Hepatobiliary: No focal liver abnormality is seen. No gallstones, gallbladder wall thickening, or biliary dilatation. Pancreas: Unremarkable. No pancreatic ductal dilatation or surrounding inflammatory changes. Spleen: Normal in size  without focal abnormality. Adrenals/Urinary Tract: No adrenal gland nodules. Subcentimeter cysts in the kidneys. No hydronephrosis. Nephrograms are symmetrical. Bladder is moderately distended without wall thickening. Stomach/Bowel: Stomach, small bowel, and colon are mostly decompressed. Scattered stool in the colon. Appendix is not identified. Vascular/Lymphatic: Aortic atherosclerosis. No enlarged abdominal or pelvic lymph nodes. Prominent calcification at the origin of the celiac axis, renal artery origins, origin of the right iliac artery, and in the proximal/mid superior mesenteric artery. Vascular stenosis is likely. Vessels are patent. Reproductive: Uterus is not enlarged. Bilobed cystic structure demonstrated in the left adnexal region probably representing ovarian cyst. This measures about 2.1 x 3.9 cm in diameter. Ultrasound follow-up at 6-12 months is recommended. Other: No free air or free fluid in the abdomen. Abdominal wall musculature appears intact. Fatty atrophy of the gluteal muscles and paraspinal muscles. Infiltration in the subcutaneous fat over the left hip probably represents soft tissue contusion. This could represent a lipoma. Musculoskeletal: Degenerative changes throughout the lumbar spine. Mild anterior subluxation of L4 on L5 is likely degenerative. Prominent degenerative changes in the hips. No acute fractures are suggested. IMPRESSION: 1. No definite acute process demonstrated in the abdomen or pelvis. No evidence of bowel obstruction or inflammation. 2. Diffuse aortic atherosclerosis. Prominent calcification and multiple branch vessels may result in stenosis. Vessels appear patent. Coronary artery calcification. 3. Left adnexal cyst measuring 3.9 cm maximal diameter. Ultrasound follow-up at 6-12 months is recommended. 4. Degenerative changes in the lumbar spine and hips. Electronically Signed   By: Burman Nieves M.D.   On: 09/17/2016 00:00   Ct Hip Right Wo Contrast  Result  Date: 09/16/2016 CLINICAL DATA:  Right hip pain after fall. EXAM: CT OF THE RIGHT HIP WITHOUT CONTRAST TECHNIQUE: Multidetector CT imaging of the right hip was performed according to the standard protocol. Multiplanar CT image reconstructions were also generated. COMPARISON:  Radiograph earlier this day. FINDINGS: Bones/Joint/Cartilage No acute fracture. Advanced degenerative change of the right hip with complete joint space loss, flattening of femoral head and subchondral cystic change and scleroses. Large femoral head neck osteophytes creating the appearance step-off on radiograph. No evidence to suggest underlying avascular necrosis. The pubic rami are intact. Included right iliac bone and sacrum are intact. No hip joint effusion. Ligaments Suboptimally assessed by CT. Muscles and Tendons Evidence intramuscular hematoma. Soft tissues Faint soft tissue edema posteriorly without confluent hematoma. Advanced atherosclerosis. Large stool burden in the ascending colon is incidentally noted. IMPRESSION: Advanced degenerative change of the right hip without acute fracture. Electronically Signed   By: Rubye Oaks M.D.   On: 09/16/2016 22:30   ____________________________________________   PROCEDURES  Procedure(s) performed:   Procedures ____________________________________________   INITIAL IMPRESSION / ASSESSMENT AND PLAN / ED COURSE  Pertinent labs & imaging results that were  available during my care of the patient were reviewed by me and considered in my medical decision making (see chart for details).  We'll check labs as patient does appear to be slightly dehydrated. Did have a CBC done earlier in the day so we'll repeat that. Did have a urine done earlier today so I'll repeat that either. We'll also get a CT a couple x-rays to ensure no broken bones but suspect patient likely be discharged back to the facility.  CMP with e/o significantly elevated liver enzymes, ct scan but no e/o  cholecystitis or choledocholithiasis. Lipase/tylenol level/hepatitis panel added on. Discussed with radiologist who recommended US in the morning, however GB did not have obvious signs of inflammation or infection here.   ____________________________________________  FINAL CLINICAL IMPRESSION(S) / ED DIAGNOSES  Final diagnoses:  Transaminitis  Weakness    MEDICATIONS GIVEN DURING THIS VISIT:  Medications  sodium chloride 0.9 % bolus 1,000 mL (not administered)  acetaminophen (TYLENOL) tablet 650 mg (not administered)  sodium chloride 0.9 % bolus 1,000 mL (1,000 mLs Intravenous New Bag/Given 09/16/16 2252)  piperacillin-tazobactam (ZOSYN) IVPB 3.375 g (0 g Intravenous Stopped 09/17/16 0024)  iopamidol (ISOVUE-300) 61 % injection 75 mL (75 mLs Intravenous Contrast Given 09/16/16 2334)    NEW OUTPATIENT MEDICATIONS STARTED DURING THIS VISIT:  New Prescriptions   No medications on file    Note:  This document was prepared using Dragon voice recognition software and may include unintentional dictation errors.    Marily MemosMesner, Paylin Hailu, MD 09/17/16 [phone]

## 2016-09-16 NOTE — ED Triage Notes (Signed)
Pt brought in by EMS from [facility]. EMS reports that pt hasnt eaten in 2 days  And pt fell last night. Pt denies pain

## 2016-09-17 ENCOUNTER — Encounter (HOSPITAL_COMMUNITY): Payer: Self-pay | Admitting: Family Medicine

## 2016-09-17 ENCOUNTER — Observation Stay (HOSPITAL_COMMUNITY): Payer: Medicare Other

## 2016-09-17 DIAGNOSIS — R5381 Other malaise: Secondary | ICD-10-CM

## 2016-09-17 DIAGNOSIS — R740 Nonspecific elevation of levels of transaminase and lactic acid dehydrogenase [LDH]: Secondary | ICD-10-CM | POA: Diagnosis not present

## 2016-09-17 DIAGNOSIS — R7989 Other specified abnormal findings of blood chemistry: Secondary | ICD-10-CM

## 2016-09-17 DIAGNOSIS — R7401 Elevation of levels of liver transaminase levels: Secondary | ICD-10-CM | POA: Insufficient documentation

## 2016-09-17 DIAGNOSIS — R627 Adult failure to thrive: Secondary | ICD-10-CM | POA: Diagnosis present

## 2016-09-17 DIAGNOSIS — R5383 Other fatigue: Secondary | ICD-10-CM

## 2016-09-17 DIAGNOSIS — R109 Unspecified abdominal pain: Secondary | ICD-10-CM | POA: Diagnosis not present

## 2016-09-17 DIAGNOSIS — R531 Weakness: Secondary | ICD-10-CM

## 2016-09-17 DIAGNOSIS — I509 Heart failure, unspecified: Secondary | ICD-10-CM

## 2016-09-17 DIAGNOSIS — F79 Unspecified intellectual disabilities: Secondary | ICD-10-CM

## 2016-09-17 DIAGNOSIS — R945 Abnormal results of liver function studies: Secondary | ICD-10-CM

## 2016-09-17 LAB — MRSA PCR SCREENING: MRSA BY PCR: NEGATIVE

## 2016-09-17 LAB — CBC
HCT: 31.4 % — ABNORMAL LOW (ref 36.0–46.0)
HEMOGLOBIN: 11.1 g/dL — AB (ref 12.0–15.0)
MCH: 32.3 pg (ref 26.0–34.0)
MCHC: 35.4 g/dL (ref 30.0–36.0)
MCV: 91.3 fL (ref 78.0–100.0)
PLATELETS: 225 10*3/uL (ref 150–400)
RBC: 3.44 MIL/uL — AB (ref 3.87–5.11)
RDW: 13.6 % (ref 11.5–15.5)
WBC: 6.1 10*3/uL (ref 4.0–10.5)

## 2016-09-17 LAB — COMPREHENSIVE METABOLIC PANEL
ALK PHOS: 163 U/L — AB (ref 38–126)
ALT: 793 U/L — AB (ref 14–54)
ANION GAP: 8 (ref 5–15)
AST: 429 U/L — ABNORMAL HIGH (ref 15–41)
Albumin: 2.7 g/dL — ABNORMAL LOW (ref 3.5–5.0)
BILIRUBIN TOTAL: 6.1 mg/dL — AB (ref 0.3–1.2)
BUN: 15 mg/dL (ref 6–20)
CALCIUM: 7.9 mg/dL — AB (ref 8.9–10.3)
CO2: 20 mmol/L — ABNORMAL LOW (ref 22–32)
CREATININE: 0.69 mg/dL (ref 0.44–1.00)
Chloride: 104 mmol/L (ref 101–111)
GFR calc non Af Amer: >60 mL/min (ref >60)
GLUCOSE: 83 mg/dL (ref 65–99)
Potassium: 3.6 mmol/L (ref 3.5–5.1)
Sodium: 132 mmol/L — ABNORMAL LOW (ref 135–145)
TOTAL PROTEIN: 4.7 g/dL — AB (ref 6.5–8.1)

## 2016-09-17 LAB — ACETAMINOPHEN LEVEL

## 2016-09-17 LAB — LIPASE, BLOOD
LIPASE: 95 U/L — AB (ref 11–51)
LIPASE: 90 U/L — AB (ref 11–51)

## 2016-09-17 MED ORDER — SODIUM CHLORIDE 0.9 % IV SOLN
INTRAVENOUS | Status: AC
  Administered 2016-09-17: 02:00:00 via INTRAVENOUS

## 2016-09-17 MED ORDER — ONDANSETRON HCL 4 MG PO TABS: 4.0000 mg | ORAL_TABLET | Freq: Four times a day (QID) | ORAL | Status: DC | PRN

## 2016-09-17 MED ORDER — PIPERACILLIN-TAZOBACTAM 3.375 G IVPB
3.3750 g | Freq: Three times a day (TID) | INTRAVENOUS | Status: DC
  Administered 2016-09-17 – 2016-09-18 (×4): 3.375 g via INTRAVENOUS
  Filled 2016-09-17 (×4): qty 50

## 2016-09-17 MED ORDER — ONDANSETRON HCL 4 MG/2ML IJ SOLN: 4.0000 mg | Freq: Four times a day (QID) | INTRAMUSCULAR | Status: DC | PRN

## 2016-09-17 MED ORDER — ACETAMINOPHEN 325 MG PO TABS
650.0000 mg | ORAL_TABLET | Freq: Once | ORAL | Status: AC
  Administered 2016-09-17: 650 mg via ORAL
  Filled 2016-09-17: qty 2

## 2016-09-17 NOTE — Progress Notes (Signed)
Noted dyspnea at rest at times. Pt's O2 saturation 99% on room air. Lung sounds clear bilaterally. Pt continues to cough up copious amounts of clear, thin sputum. Pt's voice and speech clear. Will continue to monitor.

## 2016-09-17 NOTE — Evaluation (Signed)
Clinical/Bedside Swallow Evaluation Patient Details  Name: Lisa [name] MRN: [id number] Date of Birth: 01-15-1923  Today's Date: 09/17/2016 Time: SLP Start Time (ACUTE ONLY): 3810 SLP Stop Time (ACUTE ONLY): 1623 SLP Time Calculation (min) (ACUTE ONLY): 40 min  Past Medical History:  Past Medical History:  Diagnosis Date  . CHF (congestive heart failure) ([place])   . DJD (degenerative joint disease)   . Hypertension   . Mental retardation    mild  . Osteoporosis    Past Surgical History: History reviewed. No pertinent surgical history. HPI:  Lisa [name] is a 83 y.o. female with medical history significant of mental retardation, CHF, HTN lives in a group home who has been sent to ED twice today for "fall".  Pt cannot provide any useful history.  It was reported she has not been eating for several days and has fallen twice.  Pt denies any abdominal pain or feeling ill.  I think her history is unreliable.  She appears jaundiced.  She looks pleasant and comfortable.  Pt referred for admission for concerns for acute cholecystitis with elevated lfts, alk phos and bili levels.   Assessment / Plan / Recommendation Clinical Impression  Pt was provided a clinical swallowing evaluation after being made NPO this afternoon after demonstrating overt coughing and expectoration of clear sputnum. Pt reports hx of hernia but is unable to give further reiliable hx. Pt was initially presented with one single ice chip which she swallowed but almost immediately SLP could hear regurgitation of the liquid. Pt then stuck her finger down her throat to generate induced emesis; she spit up at least 4 Tbsp's of clear phlegm. SLP waited and then pt reported it was "better". Pt attempted a few bites of puree (applesauce) which she swallowed with no overt s/sx of aspiration but after 3 bites she again induced emesis by sticking her finger down her throat. Pt reports she "cannot eat". Pt reports she must induce emesis when she  feels the food or liquid "won't go down". Question severe esophageal dysphagia or difficulty d/t reported/possible hernia; recommend GI consult and MBS. Recommend pt remain NPO pending these evaluations d/t consistent induced emesis with all presentations of solid and liquid textures. ST to f/u with MBS SLP Visit Diagnosis: Dysphagia, unspecified (R13.10)    Aspiration Risk  Mild aspiration risk;Moderate aspiration risk    Diet Recommendation NPO   Supervision: Patient able to self feed Compensations: Minimize environmental distractions    Other  Recommendations Recommended Consults: Consider GI evaluation;Consider esophageal assessment Oral Care Recommendations: Oral care QID   Follow up Recommendations        Frequency and Duration min 2x/week  2 weeks       Prognosis Prognosis for Safe Diet Advancement: Fair Barriers to Reach Goals: Cognitive deficits;Time post onset;Severity of deficits      Swallow Study   General Date of Onset: 09/16/16 HPI: Lisa [name] is a 83 y.o. female with medical history significant of mental retardation, CHF, HTN lives in a group home who has been sent to ED twice today for "fall".  Pt cannot provide any useful history.  It was reported she has not been eating for several days and has fallen twice.  Pt denies any abdominal pain or feeling ill.  I think her history is unreliable.  She appears jaundiced.  She looks pleasant and comfortable.  Pt referred for admission for concerns for acute cholecystitis with elevated lfts, alk phos and bili levels. Type of Study: Bedside Swallow Evaluation Previous  Swallow Assessment: none Diet Prior to this Study: NPO Temperature Spikes Noted: No Respiratory Status: Room air History of Recent Intubation: No Behavior/Cognition: Alert;Cooperative;Pleasant mood Oral Cavity Assessment: Within Functional Limits Oral Cavity - Dentition: Missing dentition Vision: Functional for self-feeding Patient Positioning: Upright  in bed Baseline Vocal Quality: Normal Volitional Cough: Strong Volitional Swallow: Able to elicit    Oral/Motor/Sensory Function     Ice Chips Ice chips: Impaired Presentation: Spoon   Thin Liquid Thin Liquid: Impaired    Nectar Thick Nectar Thick Liquid: Impaired   Honey Thick     Puree Puree: Impaired   Solid   GO  3          H.  MA, CCC-SLP Speech Language Pathologist  Solid: Impaired    Functional Assessment Tool Used: clinical judgement Functional Limitations: Swallowing Swallow Current Status (G8996): At least 40 percent but less than 60 percent impaired, limited or restricted Swallow Goal Status (G8997): At least 20 percent but less than 40 percent impaired, limited or restricted Swallow Discharge Status (G8998): At least 20 percent but less than 40 percent impaired, limited or restricted    H  09/17/2016,4:24 PM     

## 2016-09-17 NOTE — Progress Notes (Signed)
Speech therapist returned my call, she informed me she would try her best to come back this afternoon or early evening to evaluate the patient.

## 2016-09-17 NOTE — Progress Notes (Signed)
Patient seen and examined, database reviewed. Admitted earlier today from assisted living facility due to a fall. In the ED was found to have significant transaminitis and was jaundiced. CT was unremarkable in reference to the biliary tract and ultrasound showed gallbladder wall thickening without cholelithiasis. GI was asked to consult, they believe biliary obstruction is likely the etiology. They have recommended an MRCP. She has also been having significant difficulties with swallowing prompting speech therapy evaluation who has recommended nothing by mouth. Will continue to follow closely.  Peggye PittEstela Hernandez, MD [facility] Pager: [phone]

## 2016-09-17 NOTE — Progress Notes (Signed)
Referring Provider: Ted Mcalpine, MD Primary Care Physician:  System, Pcp Not In Primary Gastroenterologist:  Dr. Karilyn Cota  Reason for Consultation:   Abnormal LFTS.  HPI:   Patient is 83 year old Caucasian female with mental retardation who was being cared for at [facility]( assisted living)lost her appetite 2 days ago. She did not experience nausea vomiting fever chills. Yesterday morning she felt. She was therefore brought to emergency room. She was noted to be jaundiced. Hip films were negative for fracture. Abdominopelvic CT failed to reveal biliary or pancreatic abnormalities. Patient was hospitalized and begun on Zosyn. She had ultrasound this morning and it was negative for cholelithiasis or dilated bile duct. Patient denies abdominal pain nausea or vomiting. She also denies diarrhea or constipation. She is hungry. She denies weight loss. She also denies heartburn, dysphagia or pruritus. She states both of her parents are deceased and her cousin Jerrye Beavers has POA.    Past Medical History:  Diagnosis Date  . CHF (congestive heart failure) ([facility])   . DJD (degenerative joint disease)   . Hypertension   . Mental retardation    mild  . Osteoporosis     History reviewed. No pertinent surgical history.  Prior to Admission medications   Medication Sig Start Date End Date Taking? Authorizing Provider  amLODipine-benazepril (LOTREL) 5-20 MG capsule Take 1 capsule by mouth daily.  06/01/15  Yes [provider]  metoprolol tartrate (LOPRESSOR) 25 MG tablet Take 25 mg by mouth 2 (two) times daily.  06/01/15  Yes [provider]  Multiple Vitamins-Minerals (PRESERVISION/LUTEIN PO) Take 1 tablet by mouth 2 (two) times daily.    Yes [provider]  Ethelda Chick (OYSTER CALCIUM) 500 MG TABS tablet Take 500 mg of elemental calcium by mouth 3 (three) times daily.    Yes [provider]  pantoprazole (PROTONIX) 40 MG tablet 40 mg.  06/01/15  Yes [provider]  sulfamethoxazole-trimethoprim (BACTRIM DS,SEPTRA DS) 800-160 MG tablet Take 1 tablet by mouth 2 (two) times daily.   Yes [provider]    Current Facility-Administered Medications  Medication Dose Route Frequency Provider Last Rate Last Dose  . ondansetron (ZOFRAN) tablet 4 mg  4 mg Oral Q6H PRN Haydee Monica, MD       Or  . ondansetron (ZOFRAN) injection 4 mg  4 mg Intravenous Q6H PRN Haydee Monica, MD      . piperacillin-tazobactam (ZOSYN) IVPB 3.375 g  3.375 g Intravenous Q8H Philip Aspen, Limmie Patricia, MD   Stopped at 09/17/16 1350    Allergies as of 09/16/2016 - Review Complete 09/16/2016  Allergen Reaction Noted  . Advil [ibuprofen]  06/16/2015  . Aspirin  06/16/2015  . Naprosyn [naproxen]  06/16/2015    History reviewed. No pertinent family history.  Social History   Social History  . Marital status: Unknown    Spouse name: N/A  . Number of children: N/A  . Years of education: N/A   Occupational History  . Not on file.   Social History Main Topics  . Smoking status: Never Smoker  . Smokeless tobacco: Never Used  . Alcohol use No  . Drug use: No  . Sexual activity: Not on file   Other Topics Concern  . Not on file   Social History Narrative  . No narrative on file    Review of Systems: See HPI, otherwise normal ROS  Physical Exam: Temp:  [98.3 F (36.8 C)-100.7 F (38.2 C)] 98.4 F (36.9 C) (09/08  1358) Pulse Rate:  [68-92] 88 (09/08 1358) Resp:  [16-18] 18 (09/08 1358) BP: (128-155)/(46-59) 134/59 (09/08 1358) SpO2:  [93 %-99 %] 99 % (09/08 1358) Weight:  [106 lb (48.1 kg)-114 lb 8 oz (51.9 kg)] 114 lb 8 oz (51.9 kg) (09/08 0359)   Well-developed thin Caucasian female who is in no acute distress. She responds appropriately to all questions. Conjunctiva was pink and sclera is icteric. Oropharyngeal mucosa is normal. She has 4 teeth in lower jaw and many more in upper. He has single prominent left submandibular lymph  node but it is freely movable. No other masses or thyromegaly noted. Cardiac exam with regular rhythm normal S1 and S2. No murmur or gallop noted. Lungs are clear to auscultation. Abdomen is flat. Bowel sounds nontender without organomegaly or masses. No peripheral edema or clubbing noted.   Lab Results:  Recent Labs  09/16/16 1029 09/17/16 0606  WBC 7.8 6.1  HGB 14.2 11.1*  HCT 39.6 31.4*  PLT 234 225   BMET  Recent Labs  09/16/16 1941 09/17/16 0606  NA 130* 132*  K 4.5 3.6  CL 100* 104  CO2 21* 20*  GLUCOSE 138* 83  BUN 22* 15  CREATININE 0.99 0.69  CALCIUM 8.5* 7.9*   LFT  Recent Labs  09/17/16 0606  PROT 4.7*  ALBUMIN 2.7*  AST 429*  ALT 793*  ALKPHOS 163*  BILITOT 6.1*   PT/INR No results for input(s): LABPROT, INR in the last 72 hours. Hepatitis Panel No results for input(s): HEPBSAG, HCVAB, HEPAIGM, HEPBIGM in the last 72 hours.  Studies/Results: Dg Pelvis 1-2 Views  Result Date: 09/16/2016 CLINICAL DATA:  Fall EXAM: PELVIS - 1-2 VIEW COMPARISON:  None. FINDINGS: Osteopenia. SI joint arthritis. Pubic symphysis and rami appear intact. Vascular calcifications. Advanced arthritis at the right hip with chronic deformity of the right femoral head. Possible step-off deformity at the right femoral head neck junction. Moderate degenerative changes of the left hip IMPRESSION: 1. Advanced arthritis of the right hip with chronic deformity of the right femoral head. Questionable step-off deformity at the right femoral head neck junction, suggest CT for further evaluation. Electronically Signed   By: Jasmine Pang M.D.   On: 09/16/2016 19:39   Dg Tibia/fibula Right  Result Date: 09/16/2016 CLINICAL DATA:  Initial evaluation for acute trauma, fall. EXAM: RIGHT TIBIA AND FIBULA - 2 VIEW COMPARISON:  None. FINDINGS: No acute fracture or dislocation. Bones are diffusely osteopenic. Degenerative changes noted about the knee and ankle. No acute soft tissue abnormality.  Prominent vascular calcifications. IMPRESSION: No acute osseous abnormality about the right tibia/fibula. Electronically Signed   By: Rise Mu M.D.   On: 09/16/2016 19:55   Ct Head Wo Contrast  Result Date: 09/16/2016 CLINICAL DATA:  Head trauma. Larey Seat out of bed last night. Lightheadedness and dizziness. Initial encounter. EXAM: CT HEAD WITHOUT CONTRAST TECHNIQUE: Contiguous axial images were obtained from the base of the skull through the vertex without intravenous contrast. COMPARISON:  None. FINDINGS: Brain: A 4 mm hyperdense focus in the right corona radiata has attenuation consistent with calcification. There is no evidence of acute cortical infarct, intracranial hemorrhage, midline shift, or extra-axial fluid collection. Mild generalized cerebral atrophy is within normal limits for age. Periventricular white matter hypoattenuation is nonspecific but compatible with mild chronic small vessel ischemic disease. Lacunar infarcts are present in the basal ganglia bilaterally. There is an 8 mm focus of calcification over the left parietal convexity without mass effect on the underlying brain, possibly  a calcified meningioma and not felt to be of any clinical significance. Vascular: Calcified atherosclerosis at the skullbase. No hyperdense vessel. Skull: No fracture. Sclerosis in the right greater sphenoid wing without aggressive features, possibly fibrous dysplasia. Sinuses/Orbits: Minimal scattered paranasal sinus mucosal thickening clear mastoid air cells. Bilateral cataract extraction. Other: None. IMPRESSION: 1. No evidence of acute intracranial hemorrhage. 2. Age indeterminate lacunar infarcts in the basal ganglia bilaterally. 3. Mild chronic small vessel ischemic disease. 4. Possible 8 mm left parietal meningioma. Electronically Signed   By: Sebastian AcheAllen  Grady M.D.   On: 09/16/2016 19:50   [place] Abdomen Complete  Result Date: 09/17/2016 CLINICAL DATA:  83 year old female with abdominal pain,  transaminitis and elevated bilirubin EXAM: ABDOMEN ULTRASOUND COMPLETE COMPARISON:  CT scan of the abdomen and pelvis 09/16/2016 FINDINGS: Gallbladder: The gallbladder is partially decompressed. Mildly striated and thickened gallbladder wall measuring 3-4 mm in diameter. No evidence of cholelithiasis. There is trace pericholecystic fluid. Per the sonographer, the sonographic Eulah PontMurphy sign was negative. Common bile duct: Diameter: Within normal limits at 5-6 mm Liver: No focal lesion identified. Within normal limits in parenchymal echogenicity. Portal vein is patent on color Doppler imaging with normal direction of blood flow towards the liver. IVC: No abnormality visualized. Pancreas: Visualized portion unremarkable. Spleen: Size and appearance within normal limits. Right Kidney: Length: 10.0 cm. Mildly echogenic renal parenchyma with increased differentiation of the corticomedullary junction. Small anechoic cystic structure within imperceptible margin measuring 0.9 cm in the upper pole consistent with a simple cyst. Left Kidney: Length: 11.1 cm. Mildly echogenic renal parenchyma. Small anechoic cystic structure within imperceptible margin in the lower pole measures up to 1.2 cm consistent with a small simple cyst. Abdominal aorta: No aneurysm visualized. Other findings: None. IMPRESSION: 1. Nonspecific findings of gallbladder wall thickening and stripe a shin as well as trace pericholecystic fluid without associated cholelithiasis or gallbladder distension. No sonographic Eulah PontMurphy sign although this sign is less reliable in the elderly population. Differential considerations include acalculous cholecystitis, and gallbladder wall thickening related to intrinsic liver disease or hypoalbuminemia. 2. Normal diameter of the common bile duct. 3. Echogenic kidneys bilaterally suggests underlying medical renal disease. 4. Bilateral simple renal cysts. Electronically Signed   By: Malachy MoanHeath  McCullough M.D.   On: 09/17/2016 10:16    Ct Abdomen Pelvis W Contrast  Result Date: 09/17/2016 CLINICAL DATA:  Patient fell off of the head. Patient was seen this morning for a fall as well. Unspecified abdominal pain. EXAM: CT ABDOMEN AND PELVIS WITH CONTRAST TECHNIQUE: Multidetector CT imaging of the abdomen and pelvis was performed using the standard protocol following bolus administration of intravenous contrast. CONTRAST:  75mL ISOVUE-300 IOPAMIDOL (ISOVUE-300) INJECTION 61% COMPARISON:  CT right hip 09/16/2016 FINDINGS: Lower chest: Motion artifact limits evaluation. Probable atelectasis and scarring in the lung bases. Calcification of the aorta, mitral valve, and coronary arteries. Hepatobiliary: No focal liver abnormality is seen. No gallstones, gallbladder wall thickening, or biliary dilatation. Pancreas: Unremarkable. No pancreatic ductal dilatation or surrounding inflammatory changes. Spleen: Normal in size without focal abnormality. Adrenals/Urinary Tract: No adrenal gland nodules. Subcentimeter cysts in the kidneys. No hydronephrosis. Nephrograms are symmetrical. Bladder is moderately distended without wall thickening. Stomach/Bowel: Stomach, small bowel, and colon are mostly decompressed. Scattered stool in the colon. Appendix is not identified. Vascular/Lymphatic: Aortic atherosclerosis. No enlarged abdominal or pelvic lymph nodes. Prominent calcification at the origin of the celiac axis, renal artery origins, origin of the right iliac artery, and in the proximal/mid superior mesenteric artery. Vascular stenosis is likely.  Vessels are patent. Reproductive: Uterus is not enlarged. Bilobed cystic structure demonstrated in the left adnexal region probably representing ovarian cyst. This measures about 2.1 x 3.9 cm in diameter. Ultrasound follow-up at 6-12 months is recommended. Other: No free air or free fluid in the abdomen. Abdominal wall musculature appears intact. Fatty atrophy of the gluteal muscles and paraspinal muscles.  Infiltration in the subcutaneous fat over the left hip probably represents soft tissue contusion. This could represent a lipoma. Musculoskeletal: Degenerative changes throughout the lumbar spine. Mild anterior subluxation of L4 on L5 is likely degenerative. Prominent degenerative changes in the hips. No acute fractures are suggested. IMPRESSION: 1. No definite acute process demonstrated in the abdomen or pelvis. No evidence of bowel obstruction or inflammation. 2. Diffuse aortic atherosclerosis. Prominent calcification and multiple branch vessels may result in stenosis. Vessels appear patent. Coronary artery calcification. 3. Left adnexal cyst measuring 3.9 cm maximal diameter. Ultrasound follow-up at 6-12 months is recommended. 4. Degenerative changes in the lumbar spine and hips. Electronically Signed   By: Burman Nieves M.D.   On: 09/17/2016 00:00   Ct Hip Right Wo Contrast  Result Date: 09/16/2016 CLINICAL DATA:  Right hip pain after fall. EXAM: CT OF THE RIGHT HIP WITHOUT CONTRAST TECHNIQUE: Multidetector CT imaging of the right hip was performed according to the standard protocol. Multiplanar CT image reconstructions were also generated. COMPARISON:  Radiograph earlier this day. FINDINGS: Bones/Joint/Cartilage No acute fracture. Advanced degenerative change of the right hip with complete joint space loss, flattening of femoral head and subchondral cystic change and scleroses. Large femoral head neck osteophytes creating the appearance step-off on radiograph. No evidence to suggest underlying avascular necrosis. The pubic rami are intact. Included right iliac bone and sacrum are intact. No hip joint effusion. Ligaments Suboptimally assessed by CT. Muscles and Tendons Evidence intramuscular hematoma. Soft tissues Faint soft tissue edema posteriorly without confluent hematoma. Advanced atherosclerosis. Large stool burden in the ascending colon is incidentally noted. IMPRESSION: Advanced degenerative change  of the right hip without acute fracture. Electronically Signed   By: Rubye Oaks M.D.   On: 09/16/2016 22:30   I have personally reviewed CT and [place].  Assessment;  Patient is 83 year old Caucasian female with mental retardation who was brought to emergency room for evaluation after she suffered a fall and she alsohad poor appetite for 2 days. Patient was noted to be jaundiced. She was suspected to have biliary tract infection  Zosyn. Imaging studies have included abdominopelvic CT and ultrasound. CT was unremarkable in reference to biliary tract and ultrasound shows gallbladder wall thickening without cholelithiasis. Patient has no symptoms. Suspect biliary obstruction given her presentation. Since bile duct is not dilated she needs to be further evaluated with MRCP before hepatocellular disease.   Mildly decreased hemoglobin possibly normal for her age.  Recommendations;  Continue Zosyn. MRCP as soon as feasible. Repeat LFTS tomorrow morning. Begin mechanical soft diet.   LOS: 0 days   Najeeb Rehman  09/17/2016, 2:00 PM

## 2016-09-17 NOTE — Progress Notes (Signed)
Pharmacy Antibiotic Note  Lisa [name] is a 83 y.o. female admitted on 09/16/2016 with intra-abdominal infection.  Pharmacy has been consulted for ZOSYN dosing.  Plan: Zosyn 3.375g IV q8h (4 hour infusion).  Monitor labs, progress, c/s  Height: 5\' 4"  (162.6 cm) Weight: 114 lb 8 oz (51.9 kg) IBW/kg (Calculated) : 54.7  Temp (24hrs), Avg:99.3 F (37.4 C), Min:98.3 F (36.8 C), Max:100.7 F (38.2 C)   Recent Labs Lab 09/16/16 1029 09/16/16 1941 09/17/16 0606  WBC 7.8  --  6.1  CREATININE  --  0.99 0.69    Estimated Creatinine Clearance: 37.5 mL/min (by C-G formula based on SCr of 0.69 mg/dL).    Allergies  Allergen Reactions  . Advil [Ibuprofen]   . Aspirin   . Naprosyn [Naproxen]    Antimicrobials this admission: Zosyn 9/8 >>   Microbiology results: 9/8 MRSA PCR: negative  Thank you for allowing pharmacy to be a part of this patient's care.  Lisa [name], Lisa [name] A 09/17/2016 11:06 AM

## 2016-09-17 NOTE — H&P (Signed)
History and Physical    Lisa [name] GBM:[id number] DOB: 03-Feb-1923 DOA: 09/16/2016  PCP: System, Pcp Not In  Patient coming from: group home  Chief Complaint:   Tried to tie shoes and slumped over  HPI: Lisa [name] is a 83 y.o. female with medical history significant of mental retardation, CHF, HTN lives in a group home who has been sent to ED twice today for "fall".  Pt cannot provide any useful history.  It was reported she has not been eating for several days and has fallen twice.  Pt denies any abdominal pain or feeling ill.  I think her history is unreliable.  She appears jaundiced.  She looks pleasant and comfortable.  Pt referred for admission for concerns for acute cholecystitis with elevated lfts, alk phos and bili levels.   Review of Systems: unobtainable from pt and unreliable  Past Medical History:  Diagnosis Date  . CHF (congestive heart failure) ([place])   . DJD (degenerative joint disease)   . Hypertension   . Mental retardation    mild  . Osteoporosis     History reviewed. No pertinent surgical history.   reports that she has never smoked. She has never used smokeless tobacco. She reports that she does not drink alcohol or use drugs.  Allergies  Allergen Reactions  . Advil [Ibuprofen]   . Aspirin   . Naprosyn [Naproxen]     History reviewed. No pertinent family history. unknown  Prior to Admission medications   Medication Sig Start Date End Date Taking? Authorizing Provider  amLODipine-benazepril (LOTREL) 5-20 MG capsule Take 1 capsule by mouth daily.  06/01/15  Yes [provider]  metoprolol tartrate (LOPRESSOR) 25 MG tablet Take 25 mg by mouth 2 (two) times daily.  06/01/15  Yes [provider]  Multiple Vitamins-Minerals (PRESERVISION/LUTEIN PO) Take 1 tablet by mouth 2 (two) times daily.    Yes [provider]  Loma [place] (OYSTER CALCIUM) 500 MG TABS tablet Take 500 mg of elemental calcium by mouth 3 (three) times daily.    Yes  [provider]  pantoprazole (PROTONIX) 40 MG tablet 40 mg.  06/01/15  Yes [provider]  sulfamethoxazole-trimethoprim (BACTRIM DS,SEPTRA DS) 800-160 MG tablet Take 1 tablet by mouth 2 (two) times daily.   Yes [provider]    Physical Exam: Vitals:   09/16/16 2230 09/16/16 2259 09/17/16 0100 09/17/16 0148  BP: (!) 133/46  (!) 128/48 (!) 143/48  Pulse: 79  78 78  Resp:      Temp:  (!) 100.7 F (38.2 C)  98.3 F (36.8 C)  TempSrc:  (S) Rectal  Oral  SpO2: 95%  93%   Weight:      Height:          Constitutional: NAD, calm, comfortable, jaundiced Vitals:   09/16/16 2230 09/16/16 2259 09/17/16 0100 09/17/16 0148  BP: (!) 133/46  (!) 128/48 (!) 143/48  Pulse: 79  78 78  Resp:      Temp:  (!) 100.7 F (38.2 C)  98.3 F (36.8 C)  TempSrc:  (S) Rectal  Oral  SpO2: 95%  93%   Weight:      Height:       Eyes: PERRL, lids and conjunctivae normal ENMT: Mucous membranes are moist. Posterior pharynx clear of any exudate or lesions.Normal dentition.  Neck: normal, supple, no masses, no thyromegaly Respiratory: clear to auscultation bilaterally, no wheezing, no crackles. Normal respiratory effort. No accessory muscle use.  Cardiovascular: Regular rate and  rhythm, no murmurs / rubs / gallops. No extremity edema. 2+ pedal pulses. No carotid bruits.  Abdomen: no tenderness, no masses palpated. No hepatosplenomegaly. Bowel sounds positive.  Musculoskeletal: no clubbing / cyanosis. No joint deformity upper and lower extremities. Good ROM, no contractures. Normal muscle tone.  Skin: no rashes, lesions, ulcers. No induration Neurologic: CN 2-12 grossly intact. Sensation intact, DTR normal. Strength 5/5 in all 4.  Psychiatric: Normal judgment and insight. Alert and oriented x 1. Normal mood.    Labs on Admission: I have personally reviewed following labs and imaging studies  CBC:  Recent Labs Lab 09/16/16 1029  WBC 7.8  HGB 14.2  HCT 39.6  MCV 91.2    PLT 450   Basic Metabolic Panel:  Recent Labs Lab 09/16/16 1941  NA 130*  K 4.5  CL 100*  CO2 21*  GLUCOSE 138*  BUN 22*  CREATININE 0.99  CALCIUM 8.5*   GFR: Estimated Creatinine Clearance: 28.1 mL/min (by C-G formula based on SCr of 0.99 mg/dL). Liver Function Tests:  Recent Labs Lab 09/16/16 1941  AST 464*  ALT 817*  ALKPHOS 192*  BILITOT 5.8*  PROT 5.1*  ALBUMIN 3.1*    Recent Labs Lab 09/16/16 2257  LIPASE 95*   Cardiac Enzymes:  Recent Labs Lab 09/16/16 1029  TROPONINI <0.03   Urine analysis:    Component Value Date/Time   COLORURINE YELLOW 09/16/2016 1006   APPEARANCEUR CLEAR 09/16/2016 1006   LABSPEC 1.005 09/16/2016 1006   PHURINE 6.0 09/16/2016 1006   GLUCOSEU NEGATIVE 09/16/2016 1006   HGBUR NEGATIVE 09/16/2016 1006   BILIRUBINUR NEGATIVE 09/16/2016 1006   KETONESUR NEGATIVE 09/16/2016 1006   PROTEINUR NEGATIVE 09/16/2016 1006   UROBILINOGEN 0.2 06/10/2010 1717   NITRITE NEGATIVE 09/16/2016 1006   LEUKOCYTESUR NEGATIVE 09/16/2016 1006    Radiological Exams on Admission: Dg Pelvis 1-2 Views  Result Date: 09/16/2016 CLINICAL DATA:  Fall EXAM: PELVIS - 1-2 VIEW COMPARISON:  None. FINDINGS: Osteopenia. SI joint arthritis. Pubic symphysis and rami appear intact. Vascular calcifications. Advanced arthritis at the right hip with chronic deformity of the right femoral head. Possible step-off deformity at the right femoral head neck junction. Moderate degenerative changes of the left hip IMPRESSION: 1. Advanced arthritis of the right hip with chronic deformity of the right femoral head. Questionable step-off deformity at the right femoral head neck junction, suggest CT for further evaluation. Electronically Signed   By: Donavan Foil M.D.   On: 09/16/2016 19:39   Dg Tibia/fibula Right  Result Date: 09/16/2016 CLINICAL DATA:  Initial evaluation for acute trauma, fall. EXAM: RIGHT TIBIA AND FIBULA - 2 VIEW COMPARISON:  None. FINDINGS: No acute  fracture or dislocation. Bones are diffusely osteopenic. Degenerative changes noted about the knee and ankle. No acute soft tissue abnormality. Prominent vascular calcifications. IMPRESSION: No acute osseous abnormality about the right tibia/fibula. Electronically Signed   By: Jeannine Boga M.D.   On: 09/16/2016 19:55   Ct Head Wo Contrast  Result Date: 09/16/2016 CLINICAL DATA:  Head trauma. [place] out of bed last night. Lightheadedness and dizziness. Initial encounter. EXAM: CT HEAD WITHOUT CONTRAST TECHNIQUE: Contiguous axial images were obtained from the base of the skull through the vertex without intravenous contrast. COMPARISON:  None. FINDINGS: Brain: A 4 mm hyperdense focus in the right corona radiata has attenuation consistent with calcification. There is no evidence of acute cortical infarct, intracranial hemorrhage, midline shift, or extra-axial fluid collection. Mild generalized cerebral atrophy is within normal limits for age. Periventricular white  matter hypoattenuation is nonspecific but compatible with mild chronic small vessel ischemic disease. Lacunar infarcts are present in the basal ganglia bilaterally. There is an 8 mm focus of calcification over the left parietal convexity without mass effect on the underlying brain, possibly a calcified meningioma and not felt to be of any clinical significance. Vascular: Calcified atherosclerosis at the skullbase. No hyperdense vessel. Skull: No fracture. Sclerosis in the right greater sphenoid wing without aggressive features, possibly fibrous dysplasia. Sinuses/Orbits: Minimal scattered paranasal sinus mucosal thickening clear mastoid air cells. Bilateral cataract extraction. Other: None. IMPRESSION: 1. No evidence of acute intracranial hemorrhage. 2. Age indeterminate lacunar infarcts in the basal ganglia bilaterally. 3. Mild chronic small vessel ischemic disease. 4. Possible 8 mm left parietal meningioma. Electronically Signed   By: Logan Bores  M.D.   On: 09/16/2016 19:50   Ct Abdomen Pelvis W Contrast  Result Date: 09/17/2016 CLINICAL DATA:  Patient fell off of the head. Patient was seen this morning for a fall as well. Unspecified abdominal pain. EXAM: CT ABDOMEN AND PELVIS WITH CONTRAST TECHNIQUE: Multidetector CT imaging of the abdomen and pelvis was performed using the standard protocol following bolus administration of intravenous contrast. CONTRAST:  29m ISOVUE-300 IOPAMIDOL (ISOVUE-300) INJECTION 61% COMPARISON:  CT right hip 09/16/2016 FINDINGS: Lower chest: Motion artifact limits evaluation. Probable atelectasis and scarring in the lung bases. Calcification of the aorta, mitral valve, and coronary arteries. Hepatobiliary: No focal liver abnormality is seen. No gallstones, gallbladder wall thickening, or biliary dilatation. Pancreas: Unremarkable. No pancreatic ductal dilatation or surrounding inflammatory changes. Spleen: Normal in size without focal abnormality. Adrenals/Urinary Tract: No adrenal gland nodules. Subcentimeter cysts in the kidneys. No hydronephrosis. Nephrograms are symmetrical. Bladder is moderately distended without wall thickening. Stomach/Bowel: Stomach, small bowel, and colon are mostly decompressed. Scattered stool in the colon. Appendix is not identified. Vascular/Lymphatic: Aortic atherosclerosis. No enlarged abdominal or pelvic lymph nodes. Prominent calcification at the origin of the celiac axis, renal artery origins, origin of the right iliac artery, and in the proximal/mid superior mesenteric artery. Vascular stenosis is likely. Vessels are patent. Reproductive: Uterus is not enlarged. Bilobed cystic structure demonstrated in the left adnexal region probably representing ovarian cyst. This measures about 2.1 x 3.9 cm in diameter. Ultrasound follow-up at 6-12 months is recommended. Other: No free air or free fluid in the abdomen. Abdominal wall musculature appears intact. Fatty atrophy of the gluteal muscles and  paraspinal muscles. Infiltration in the subcutaneous fat over the left hip probably represents soft tissue contusion. This could represent a lipoma. Musculoskeletal: Degenerative changes throughout the lumbar spine. Mild anterior subluxation of L4 on L5 is likely degenerative. Prominent degenerative changes in the hips. No acute fractures are suggested. IMPRESSION: 1. No definite acute process demonstrated in the abdomen or pelvis. No evidence of bowel obstruction or inflammation. 2. Diffuse aortic atherosclerosis. Prominent calcification and multiple branch vessels may result in stenosis. Vessels appear patent. Coronary artery calcification. 3. Left adnexal cyst measuring 3.9 cm maximal diameter. Ultrasound follow-up at 6-12 months is recommended. 4. Degenerative changes in the lumbar spine and hips. Electronically Signed   By: WLucienne CapersM.D.   On: 09/17/2016 00:00   Ct Hip Right Wo Contrast  Result Date: 09/16/2016 CLINICAL DATA:  Right hip pain after fall. EXAM: CT OF THE RIGHT HIP WITHOUT CONTRAST TECHNIQUE: Multidetector CT imaging of the right hip was performed according to the standard protocol. Multiplanar CT image reconstructions were also generated. COMPARISON:  Radiograph earlier this day. FINDINGS: Bones/Joint/Cartilage No acute  fracture. Advanced degenerative change of the right hip with complete joint space loss, flattening of femoral head and subchondral cystic change and scleroses. Large femoral head neck osteophytes creating the appearance step-off on radiograph. No evidence to suggest underlying avascular necrosis. The pubic rami are intact. Included right iliac bone and sacrum are intact. No hip joint effusion. Ligaments Suboptimally assessed by CT. Muscles and Tendons Evidence intramuscular hematoma. Soft tissues Faint soft tissue edema posteriorly without confluent hematoma. Advanced atherosclerosis. Large stool burden in the ascending colon is incidentally noted. IMPRESSION: Advanced  degenerative change of the right hip without acute fracture. Electronically Signed   By: Jeb Levering M.D.   On: 09/16/2016 22:30     Assessment/Plan 83 yo female sent in for not eating and falling found to have elevated lfts, alk phos and bili   Principal Problem:   Abdominal pain- she denies this now.  Ct unrevealing.  Obtain abd u/s in am to look at gallbladder better.  ivf and repeat cmp and lipase in am.  If persistently elevated consider MRCP.  With low grade temp and elev wbc will cover with zosyn.  Pt looks good clinically with benign abd exam.  Active Problems:   Elevated LFTs- as above   Weakness- noted   FTT (failure to thrive) in adult- noted   CHF (congestive heart failure) ([place])- compensated at this time   Mental retardation- noted   Med rec pending   DVT prophylaxis: scds Code Status:  full Family Communication:  none  Disposition Plan:  Per day team Consults called:  none Admission status:  observation   Telicia Hodgkiss A MD [facility]  If 7PM-7AM, please contact night-coverage [email or web address] Password [email or web address]  09/17/2016, 2:28 AM

## 2016-09-17 NOTE — Progress Notes (Signed)
Patient was advanced from NPO to dysphagia 3 diet for lunch. Patient was found to be choking and spitting up thick, clear mucous after swallowing food and fluid. The patient was assisted in sitting up higher and was able to cough "mucuous" up. MD was called, ordered to make patient NPO again and to order a speech evaluation. Speech therapist called, she told me she would collaborate with the other speech therapist and let me know when the patient could be seen. I am currently awaiting a return phone call.

## 2016-09-18 ENCOUNTER — Observation Stay (HOSPITAL_COMMUNITY): Payer: Medicare Other

## 2016-09-18 DIAGNOSIS — R627 Adult failure to thrive: Secondary | ICD-10-CM | POA: Diagnosis not present

## 2016-09-18 DIAGNOSIS — R109 Unspecified abdominal pain: Secondary | ICD-10-CM | POA: Diagnosis not present

## 2016-09-18 DIAGNOSIS — R1010 Upper abdominal pain, unspecified: Secondary | ICD-10-CM

## 2016-09-18 DIAGNOSIS — R7989 Other specified abnormal findings of blood chemistry: Secondary | ICD-10-CM | POA: Diagnosis not present

## 2016-09-18 LAB — HEPATIC FUNCTION PANEL
ALBUMIN: 2.4 g/dL — AB (ref 3.5–5.0)
ALT: 773 U/L — AB (ref 14–54)
AST: 448 U/L — AB (ref 15–41)
Alkaline Phosphatase: 167 U/L — ABNORMAL HIGH (ref 38–126)
Bilirubin, Direct: 5.8 mg/dL — ABNORMAL HIGH (ref 0.1–0.5)
Indirect Bilirubin: 2.4 mg/dL — ABNORMAL HIGH (ref 0.3–0.9)
Total Bilirubin: 8.2 mg/dL — ABNORMAL HIGH (ref 0.3–1.2)
Total Protein: 4.2 g/dL — ABNORMAL LOW (ref 6.5–8.1)

## 2016-09-18 LAB — PROTIME-INR
INR: 1.26
PROTHROMBIN TIME: 15.7 s — AB (ref 11.4–15.2)

## 2016-09-18 LAB — HEPATITIS PANEL, ACUTE
HEP B S AG: NEGATIVE
Hep A IgM: NEGATIVE
Hep B C IgM: NEGATIVE

## 2016-09-18 LAB — SEDIMENTATION RATE: Sed Rate: 7 mm/hr (ref 0–22)

## 2016-09-18 MED ORDER — SODIUM CHLORIDE 0.9 % IV SOLN
INTRAVENOUS | Status: DC
  Administered 2016-09-18 – 2016-09-21 (×4): via INTRAVENOUS

## 2016-09-18 NOTE — Progress Notes (Signed)
  Subjective:  Patient has no complaints. She denies abdominal pain nausea or vomiting.patient was noted to be coughing while eating. She has been evaluated by Mrs. Bradd CanaryEmelia [place]ar[place] CCC-SLP & is to undergo modified barium study .  Objective: Blood pressure (!) 108/40, pulse 71, temperature 98 F (36.7 C), temperature source Oral, resp. rate 18, height 5\' 4"  (1.626 m), weight 114 lb 10.2 oz (52 kg), SpO2 96 %. Patient is alert and in no acute distress. She remains jaundiced. Abdomen is flat soft and non-tender without organomegaly or masses. No LE edema noted.  Labs/studies Results:   Recent Labs  09/16/16 1029 09/17/16 0606  WBC 7.8 6.1  HGB 14.2 11.1*  HCT 39.6 31.4*  PLT 234 225    BMET   Recent Labs  09/16/16 1941 09/17/16 0606  NA 130* 132*  K 4.5 3.6  CL 100* 104  CO2 21* 20*  GLUCOSE 138* 83  BUN 22* 15  CREATININE 0.99 0.69  CALCIUM 8.5* 7.9*    LFT   Recent Labs  09/16/16 1941 09/17/16 0606 09/18/16 0555  PROT 5.1* 4.7* 4.2*  ALBUMIN 3.1* 2.7* 2.4*  AST 464* 429* 448*  ALT 817* 793* 773*  ALKPHOS 192* 163* 167*  BILITOT 5.8* 6.1* 8.2*  BILIDIR  --   --  5.8*  IBILI  --   --  2.4*    PT/INR   Recent Labs  09/18/16 0555  LABPROT 15.7*  INR 1.26    Hepatitis Panel   Recent Labs  09/16/16 2257  HEPBSAG Negative  HCVAB <0.1  HEPAIGM Negative  HEPBIGM Negative    MRCP [facility].no evidence of dilated bile duct. Gallbladder wall thickening and small bilateral pleural effusions  Assessment:  #1. Abnormal LFTs. She has both cholestasis and significant elevation of transaminases. Viral markers for hep A, B, and C are negative. No evidence of dilated bile duct on MRCP which is very poor quality study because patient could not hold breath. Since no evidence of biliary tract disease on imaging studies will proceed with further workup.  #2. Oropharyngeal Dysphagia. She is scheduled to undergo  barium swallow tomorrow.   Recommendations:  Will check sedimentation rate, ANA and SMA. HCV RNA by PCR. Repeat LFTs in a.m.

## 2016-09-18 NOTE — Progress Notes (Signed)
PROGRESS NOTE    Lisa [name]  WUJ:[id number] DOB: 05/19/1923 DOA: 09/16/2016 PCP: System, Pcp Not In     Brief Narrative:  83 year old woman admitted to the hospital on 9/8 from her facility after suffering a fall. She was noticed to have elevated LFTs and admission was requested.   Assessment & Plan:   Principal Problem:   Abdominal pain Active Problems:   Elevated LFTs   Weakness   FTT (failure to thrive) in adult   CHF (congestive heart failure) (HCC)   Mental retardation   Abnormal LFTs -In both a cholestatic and pure transaminase pattern. -Ultrasound, CT scan and MRCP did not show evidence of bile duct dilatation. -GI is on board and following. -Viral hepatitis markers are negative. -Workup for autoimmune hepatitis is been requested including SMA, ANA, ESR.  Abdominal pain -Resolved, suspected due to above.  Dysphagia -Failed speech therapy evaluation, is for MBS. -If MBS is unrevealing, mainly EGD. GI is already on board.  History of mental retardation -Noted   DVT prophylaxis: SCDs Code Status: Full code Family Communication: Patient only Disposition Plan: Pending completion of medical workup  Consultants:   GI  Procedures:   None  Antimicrobials:  Anti-infectives    Start     Dose/Rate Route Frequency Ordered Stop   09/17/16 0800  piperacillin-tazobactam (ZOSYN) IVPB 3.375 g     3.375 g 12.5 mL/hr over 240 Minutes Intravenous Every 8 hours 09/17/16 0748     09/16/16 2330  piperacillin-tazobactam (ZOSYN) IVPB 3.375 g     3.375 g 100 mL/hr over 30 Minutes Intravenous  Once 09/16/16 2325 09/17/16 0024       Subjective: Alert, awake, feels well, continues to have reported issues with swallowing.  Objective: Vitals:   09/17/16 2141 09/18/16 0352 09/18/16 0434 09/18/16 1300  BP:   (!) 108/40 (!) 136/43  Pulse:   71 82  Resp:   18 16  Temp:   98 F (36.7 C) 98.2 F (36.8 C)  TempSrc:   Oral Oral  SpO2: 92%  96% 97%  Weight:  52 kg  (114 lb 10.2 oz)    Height:        Intake/Output Summary (Last 24 hours) at 09/18/16 1439 Last data filed at 09/18/16 0900  Gross per 24 hour  Intake              150 ml  Output             1250 ml  Net            -1100 ml   Filed Weights   09/16/16 1828 09/17/16 0359 09/18/16 0352  Weight: 48.1 kg (106 lb) 51.9 kg (114 lb 8 oz) 52 kg (114 lb 10.2 oz)    Examination:  General exam: Alert, awake, oriented x 3 Respiratory system: Clear to auscultation. Respiratory effort normal. Cardiovascular system:RRR. No murmurs, rubs, gallops. Gastrointestinal system: Abdomen is nondistended, soft and nontender. No organomegaly or masses felt. Normal bowel sounds heard. Central nervous system: Alert and oriented. No focal neurological deficits. Extremities: No C/C/E, +pedal pulses Skin: No rashes, lesions or ulcers Psychiatry: Judgement and insight appear normal. Mood & affect appropriate.     Data Reviewed: I have personally reviewed following labs and imaging studies  CBC:  Recent Labs Lab 09/16/16 1029 09/17/16 0606  WBC 7.8 6.1  HGB 14.2 11.1*  HCT 39.6 31.4*  MCV 91.2 91.3  PLT 234 956   Basic Metabolic Panel:  Recent Labs Lab 09/16/16 1941  09/17/16 0606  NA 130* 132*  K 4.5 3.6  CL 100* 104  CO2 21* 20*  GLUCOSE 138* 83  BUN 22* 15  CREATININE 0.99 0.69  CALCIUM 8.5* 7.9*   GFR: Estimated Creatinine Clearance: 37.6 mL/min (by C-G formula based on SCr of 0.69 mg/dL). Liver Function Tests:  Recent Labs Lab 09/16/16 1941 09/17/16 0606 09/18/16 0555  AST 464* 429* 448*  ALT 817* 793* 773*  ALKPHOS 192* 163* 167*  BILITOT 5.8* 6.1* 8.2*  PROT 5.1* 4.7* 4.2*  ALBUMIN 3.1* 2.7* 2.4*    Recent Labs Lab 09/16/16 2257 09/17/16 0606  LIPASE 95* 90*   No results for input(s): AMMONIA in the last 168 hours. Coagulation Profile:  Recent Labs Lab 09/18/16 0555  INR 1.26   Cardiac Enzymes:  Recent Labs Lab 09/16/16 1029  TROPONINI <0.03   BNP  (last 3 results) No results for input(s): PROBNP in the last 8760 hours. HbA1C: No results for input(s): HGBA1C in the last 72 hours. CBG: No results for input(s): GLUCAP in the last 168 hours. Lipid Profile: No results for input(s): CHOL, HDL, LDLCALC, TRIG, CHOLHDL, LDLDIRECT in the last 72 hours. Thyroid Function Tests: No results for input(s): TSH, T4TOTAL, FREET4, T3FREE, THYROIDAB in the last 72 hours. Anemia Panel: No results for input(s): VITAMINB12, FOLATE, FERRITIN, TIBC, IRON, RETICCTPCT in the last 72 hours. Urine analysis:    Component Value Date/Time   COLORURINE YELLOW 09/16/2016 1006   APPEARANCEUR CLEAR 09/16/2016 1006   LABSPEC 1.005 09/16/2016 1006   PHURINE 6.0 09/16/2016 1006   GLUCOSEU NEGATIVE 09/16/2016 1006   HGBUR NEGATIVE 09/16/2016 1006   BILIRUBINUR NEGATIVE 09/16/2016 1006   KETONESUR NEGATIVE 09/16/2016 1006   PROTEINUR NEGATIVE 09/16/2016 1006   UROBILINOGEN 0.2 06/10/2010 1717   NITRITE NEGATIVE 09/16/2016 1006   LEUKOCYTESUR NEGATIVE 09/16/2016 1006   Sepsis Labs: _0 (procalcitonin:4,lacticidven:4)  ) Recent Results (from the past 240 hour(s))  MRSA PCR Screening     Status: None   Collection Time: 09/17/16  2:20 AM  Result Value Ref Range Status   MRSA by PCR NEGATIVE NEGATIVE Final    Comment:        The GeneXpert MRSA Assay (FDA approved for NASAL specimens only), is one component of a comprehensive MRSA colonization surveillance program. It is not intended to diagnose MRSA infection nor to guide or monitor treatment for MRSA infections.          Radiology Studies: Dg Pelvis 1-2 Views  Result Date: 09/16/2016 CLINICAL DATA:  Fall EXAM: PELVIS - 1-2 VIEW COMPARISON:  None. FINDINGS: Osteopenia. SI joint arthritis. Pubic symphysis and rami appear intact. Vascular calcifications. Advanced arthritis at the right hip with chronic deformity of the right femoral head. Possible step-off deformity at the right femoral head  neck junction. Moderate degenerative changes of the left hip IMPRESSION: 1. Advanced arthritis of the right hip with chronic deformity of the right femoral head. Questionable step-off deformity at the right femoral head neck junction, suggest CT for further evaluation. Electronically Signed   By: Donavan Foil M.D.   On: 09/16/2016 19:39   Dg Tibia/fibula Right  Result Date: 09/16/2016 CLINICAL DATA:  Initial evaluation for acute trauma, fall. EXAM: RIGHT TIBIA AND FIBULA - 2 VIEW COMPARISON:  None. FINDINGS: No acute fracture or dislocation. Bones are diffusely osteopenic. Degenerative changes noted about the knee and ankle. No acute soft tissue abnormality. Prominent vascular calcifications. IMPRESSION: No acute osseous abnormality about the right tibia/fibula. Electronically Signed   By: Marland Kitchen  Jeannine Boga M.D.   On: 09/16/2016 19:55   Ct Head Wo Contrast  Result Date: 09/16/2016 CLINICAL DATA:  Head trauma. [place] out of bed last night. Lightheadedness and dizziness. Initial encounter. EXAM: CT HEAD WITHOUT CONTRAST TECHNIQUE: Contiguous axial images were obtained from the base of the skull through the vertex without intravenous contrast. COMPARISON:  None. FINDINGS: Brain: A 4 mm hyperdense focus in the right corona radiata has attenuation consistent with calcification. There is no evidence of acute cortical infarct, intracranial hemorrhage, midline shift, or extra-axial fluid collection. Mild generalized cerebral atrophy is within normal limits for age. Periventricular white matter hypoattenuation is nonspecific but compatible with mild chronic small vessel ischemic disease. Lacunar infarcts are present in the basal ganglia bilaterally. There is an 8 mm focus of calcification over the left parietal convexity without mass effect on the underlying brain, possibly a calcified meningioma and not felt to be of any clinical significance. Vascular: Calcified atherosclerosis at the skullbase. No hyperdense  vessel. Skull: No fracture. Sclerosis in the right greater sphenoid wing without aggressive features, possibly fibrous dysplasia. Sinuses/Orbits: Minimal scattered paranasal sinus mucosal thickening clear mastoid air cells. Bilateral cataract extraction. Other: None. IMPRESSION: 1. No evidence of acute intracranial hemorrhage. 2. Age indeterminate lacunar infarcts in the basal ganglia bilaterally. 3. Mild chronic small vessel ischemic disease. 4. Possible 8 mm left parietal meningioma. Electronically Signed   By: Logan Bores M.D.   On: 09/16/2016 19:50   US Abdomen Complete  Result Date: 09/17/2016 CLINICAL DATA:  83 year old female with abdominal pain, transaminitis and elevated bilirubin EXAM: ABDOMEN ULTRASOUND COMPLETE COMPARISON:  CT scan of the abdomen and pelvis 09/16/2016 FINDINGS: Gallbladder: The gallbladder is partially decompressed. Mildly striated and thickened gallbladder wall measuring 3-4 mm in diameter. No evidence of cholelithiasis. There is trace pericholecystic fluid. Per the sonographer, the sonographic Percell Miller sign was negative. Common bile duct: Diameter: Within normal limits at 5-6 mm Liver: No focal lesion identified. Within normal limits in parenchymal echogenicity. Portal vein is patent on color Doppler imaging with normal direction of blood flow towards the liver. IVC: No abnormality visualized. Pancreas: Visualized portion unremarkable. Spleen: Size and appearance within normal limits. Right Kidney: Length: 10.0 cm. Mildly echogenic renal parenchyma with increased differentiation of the corticomedullary junction. Small anechoic cystic structure within imperceptible margin measuring 0.9 cm in the upper pole consistent with a simple cyst. Left Kidney: Length: 11.1 cm. Mildly echogenic renal parenchyma. Small anechoic cystic structure within imperceptible margin in the lower pole measures up to 1.2 cm consistent with a small simple cyst. Abdominal aorta: No aneurysm visualized. Other  findings: None. IMPRESSION: 1. Nonspecific findings of gallbladder wall thickening and stripe a shin as well as trace pericholecystic fluid without associated cholelithiasis or gallbladder distension. No sonographic Percell Miller sign although this sign is less reliable in the elderly population. Differential considerations include acalculous cholecystitis, and gallbladder wall thickening related to intrinsic liver disease or hypoalbuminemia. 2. Normal diameter of the common bile duct. 3. Echogenic kidneys bilaterally suggests underlying medical renal disease. 4. Bilateral simple renal cysts. Electronically Signed   By: Jacqulynn Cadet M.D.   On: 09/17/2016 10:16   Ct Abdomen Pelvis W Contrast  Result Date: 09/17/2016 CLINICAL DATA:  Patient fell off of the head. Patient was seen this morning for a fall as well. Unspecified abdominal pain. EXAM: CT ABDOMEN AND PELVIS WITH CONTRAST TECHNIQUE: Multidetector CT imaging of the abdomen and pelvis was performed using the standard protocol following bolus administration of intravenous contrast. CONTRAST:  87m  ISOVUE-300 IOPAMIDOL (ISOVUE-300) INJECTION 61% COMPARISON:  CT right hip 09/16/2016 FINDINGS: Lower chest: Motion artifact limits evaluation. Probable atelectasis and scarring in the lung bases. Calcification of the aorta, mitral valve, and coronary arteries. Hepatobiliary: No focal liver abnormality is seen. No gallstones, gallbladder wall thickening, or biliary dilatation. Pancreas: Unremarkable. No pancreatic ductal dilatation or surrounding inflammatory changes. Spleen: Normal in size without focal abnormality. Adrenals/Urinary Tract: No adrenal gland nodules. Subcentimeter cysts in the kidneys. No hydronephrosis. Nephrograms are symmetrical. Bladder is moderately distended without wall thickening. Stomach/Bowel: Stomach, small bowel, and colon are mostly decompressed. Scattered stool in the colon. Appendix is not identified. Vascular/Lymphatic: Aortic  atherosclerosis. No enlarged abdominal or pelvic lymph nodes. Prominent calcification at the origin of the celiac axis, renal artery origins, origin of the right iliac artery, and in the proximal/mid superior mesenteric artery. Vascular stenosis is likely. Vessels are patent. Reproductive: Uterus is not enlarged. Bilobed cystic structure demonstrated in the left adnexal region probably representing ovarian cyst. This measures about 2.1 x 3.9 cm in diameter. Ultrasound follow-up at 6-12 months is recommended. Other: No free air or free fluid in the abdomen. Abdominal wall musculature appears intact. Fatty atrophy of the gluteal muscles and paraspinal muscles. Infiltration in the subcutaneous fat over the left hip probably represents soft tissue contusion. This could represent a lipoma. Musculoskeletal: Degenerative changes throughout the lumbar spine. Mild anterior subluxation of L4 on L5 is likely degenerative. Prominent degenerative changes in the hips. No acute fractures are suggested. IMPRESSION: 1. No definite acute process demonstrated in the abdomen or pelvis. No evidence of bowel obstruction or inflammation. 2. Diffuse aortic atherosclerosis. Prominent calcification and multiple branch vessels may result in stenosis. Vessels appear patent. Coronary artery calcification. 3. Left adnexal cyst measuring 3.9 cm maximal diameter. Ultrasound follow-up at 6-12 months is recommended. 4. Degenerative changes in the lumbar spine and hips. Electronically Signed   By: Burman Nieves M.D.   On: 09/17/2016 00:00   Ct Hip Right Wo Contrast  Result Date: 09/16/2016 CLINICAL DATA:  Right hip pain after fall. EXAM: CT OF THE RIGHT HIP WITHOUT CONTRAST TECHNIQUE: Multidetector CT imaging of the right hip was performed according to the standard protocol. Multiplanar CT image reconstructions were also generated. COMPARISON:  Radiograph earlier this day. FINDINGS: Bones/Joint/Cartilage No acute fracture. Advanced degenerative  change of the right hip with complete joint space loss, flattening of femoral head and subchondral cystic change and scleroses. Large femoral head neck osteophytes creating the appearance step-off on radiograph. No evidence to suggest underlying avascular necrosis. The pubic rami are intact. Included right iliac bone and sacrum are intact. No hip joint effusion. Ligaments Suboptimally assessed by CT. Muscles and Tendons Evidence intramuscular hematoma. Soft tissues Faint soft tissue edema posteriorly without confluent hematoma. Advanced atherosclerosis. Large stool burden in the ascending colon is incidentally noted. IMPRESSION: Advanced degenerative change of the right hip without acute fracture. Electronically Signed   By: Rubye Oaks M.D.   On: 09/16/2016 22:30   Mr Abdomen Mrcp Wo Contrast  Result Date: 09/18/2016 CLINICAL DATA:  Patient with abnormal LFTs. Jaundice. Abdominal pain. EXAM: MRI ABDOMEN WITHOUT CONTRAST  (INCLUDING MRCP) TECHNIQUE: Multiplanar multisequence MR imaging of the abdomen was performed. Heavily T2-weighted images of the biliary and pancreatic ducts were obtained, and three-dimensional MRCP images were rendered by post processing. COMPARISON:  CT abdomen pelvis 09/16/2016; abdominal ultrasound 09/17/2016. FINDINGS: Markedly limited exam due to motion artifact and patient's inability to breath hold. Lower chest: Small bilateral pleural effusions. Probable dependent  atelectasis bilateral lower lobes. Cardiomegaly. Hepatobiliary: The liver is normal in size and contour. There is wall thickening of the gallbladder measuring up to 4 mm. Small amount of pericholecystic fluid. No definite filling defects identified within the gallbladder lumen. No definite intrahepatic or extrahepatic biliary ductal dilatation. Common bile duct measures 4 mm. MRCP images nondiagnostic due to motion artifact. Pancreas:  Unremarkable Spleen:  Unremarkable Adrenals/Urinary Tract: Normal adrenal glands.  Kidneys are symmetric in size. No hydronephrosis. 10 mm cyst interpolar region right kidney. 10 mm cyst interpolar region left kidney. Stomach/Bowel: Visualized large and small bowel is unremarkable. No evidence for bowel obstruction. Vascular/Lymphatic: Normal caliber abdominal aorta. No retroperitoneal lymphadenopathy. Other:  There is a 4.1 cm left adnexal cystic lesion. Musculoskeletal: Mild third spacing of fluid. No aggressive or acute appearing osseous lesions. IMPRESSION: Markedly limited exam secondary to motion artifact from patient's inability to breath hold. There is mild gallbladder wall thickening and pericholecystic fluid. The few visualized segments of the common bile duct are normal in diameter. No definite intraluminal filling defect identified although the majority of the common bile duct is not adequately assessed due to motion artifact. Findings are nonspecific however acalculous cholecystitis is a consideration. 4.1 cm left adnexal cystic lesion. In the nonacute setting, recommend further evaluation with pelvic ultrasound. These results were called by telephone at the time of interpretation on 09/18/2016 at 12:29 pm to Dr. Hildred Laser , who verbally acknowledged these results. Electronically Signed   By: Lovey Newcomer M.D.   On: 09/18/2016 12:36   Mr 3d Recon At Scanner  Result Date: 09/18/2016 CLINICAL DATA:  Patient with abnormal LFTs. Jaundice. Abdominal pain. EXAM: MRI ABDOMEN WITHOUT CONTRAST  (INCLUDING MRCP) TECHNIQUE: Multiplanar multisequence MR imaging of the abdomen was performed. Heavily T2-weighted images of the biliary and pancreatic ducts were obtained, and three-dimensional MRCP images were rendered by post processing. COMPARISON:  CT abdomen pelvis 09/16/2016; abdominal ultrasound 09/17/2016. FINDINGS: Markedly limited exam due to motion artifact and patient's inability to breath hold. Lower chest: Small bilateral pleural effusions. Probable dependent atelectasis bilateral  lower lobes. Cardiomegaly. Hepatobiliary: The liver is normal in size and contour. There is wall thickening of the gallbladder measuring up to 4 mm. Small amount of pericholecystic fluid. No definite filling defects identified within the gallbladder lumen. No definite intrahepatic or extrahepatic biliary ductal dilatation. Common bile duct measures 4 mm. MRCP images nondiagnostic due to motion artifact. Pancreas:  Unremarkable Spleen:  Unremarkable Adrenals/Urinary Tract: Normal adrenal glands. Kidneys are symmetric in size. No hydronephrosis. 10 mm cyst interpolar region right kidney. 10 mm cyst interpolar region left kidney. Stomach/Bowel: Visualized large and small bowel is unremarkable. No evidence for bowel obstruction. Vascular/Lymphatic: Normal caliber abdominal aorta. No retroperitoneal lymphadenopathy. Other:  There is a 4.1 cm left adnexal cystic lesion. Musculoskeletal: Mild third spacing of fluid. No aggressive or acute appearing osseous lesions. IMPRESSION: Markedly limited exam secondary to motion artifact from patient's inability to breath hold. There is mild gallbladder wall thickening and pericholecystic fluid. The few visualized segments of the common bile duct are normal in diameter. No definite intraluminal filling defect identified although the majority of the common bile duct is not adequately assessed due to motion artifact. Findings are nonspecific however acalculous cholecystitis is a consideration. 4.1 cm left adnexal cystic lesion. In the nonacute setting, recommend further evaluation with pelvic ultrasound. These results were called by telephone at the time of interpretation on 09/18/2016 at 12:29 pm to Dr. Hildred Laser , who verbally acknowledged these results.  Electronically Signed   By: Lovey Newcomer M.D.   On: 09/18/2016 12:36        Scheduled Meds: Continuous Infusions: . sodium chloride    . piperacillin-tazobactam (ZOSYN)  IV Stopped (09/18/16 1201)     LOS: 0 days     Time spent: 30 minutes. Greater than 50% of this time was spent in direct contact with the patient coordinating care.     Lelon Frohlich, MD [facility] Pager [phone]  If 7PM-7AM, please contact night-coverage [email or web address] Password [email or web address] 09/18/2016, 2:39 PM

## 2016-09-18 NOTE — Progress Notes (Signed)
Dr. Ardyth HarpsHernandez paged and made aware that patient has not voided since last night at midnight. Bladder scan shows 240 cc's. Will continue to monitor.

## 2016-09-19 ENCOUNTER — Observation Stay (HOSPITAL_COMMUNITY): Payer: Medicare Other

## 2016-09-19 DIAGNOSIS — R627 Adult failure to thrive: Secondary | ICD-10-CM | POA: Diagnosis present

## 2016-09-19 DIAGNOSIS — R740 Nonspecific elevation of levels of transaminase and lactic acid dehydrogenase [LDH]: Secondary | ICD-10-CM | POA: Diagnosis not present

## 2016-09-19 DIAGNOSIS — R7989 Other specified abnormal findings of blood chemistry: Secondary | ICD-10-CM | POA: Diagnosis not present

## 2016-09-19 DIAGNOSIS — I110 Hypertensive heart disease with heart failure: Secondary | ICD-10-CM | POA: Diagnosis present

## 2016-09-19 DIAGNOSIS — K831 Obstruction of bile duct: Secondary | ICD-10-CM | POA: Diagnosis present

## 2016-09-19 DIAGNOSIS — R1312 Dysphagia, oropharyngeal phase: Secondary | ICD-10-CM | POA: Diagnosis present

## 2016-09-19 DIAGNOSIS — Z79899 Other long term (current) drug therapy: Secondary | ICD-10-CM | POA: Diagnosis not present

## 2016-09-19 DIAGNOSIS — R296 Repeated falls: Secondary | ICD-10-CM | POA: Diagnosis present

## 2016-09-19 DIAGNOSIS — W06XXXA Fall from bed, initial encounter: Secondary | ICD-10-CM | POA: Diagnosis present

## 2016-09-19 DIAGNOSIS — R531 Weakness: Secondary | ICD-10-CM | POA: Diagnosis present

## 2016-09-19 DIAGNOSIS — M810 Age-related osteoporosis without current pathological fracture: Secondary | ICD-10-CM | POA: Diagnosis present

## 2016-09-19 DIAGNOSIS — I509 Heart failure, unspecified: Secondary | ICD-10-CM | POA: Diagnosis present

## 2016-09-19 DIAGNOSIS — F79 Unspecified intellectual disabilities: Secondary | ICD-10-CM | POA: Diagnosis present

## 2016-09-19 DIAGNOSIS — R109 Unspecified abdominal pain: Secondary | ICD-10-CM | POA: Diagnosis not present

## 2016-09-19 DIAGNOSIS — Z886 Allergy status to analgesic agent status: Secondary | ICD-10-CM | POA: Diagnosis not present

## 2016-09-19 LAB — HEPATIC FUNCTION PANEL
ALT: 827 U/L — AB (ref 14–54)
AST: 502 U/L — AB (ref 15–41)
Albumin: 2.3 g/dL — ABNORMAL LOW (ref 3.5–5.0)
Alkaline Phosphatase: 195 U/L — ABNORMAL HIGH (ref 38–126)
BILIRUBIN DIRECT: 8.1 mg/dL — AB (ref 0.1–0.5)
Indirect Bilirubin: 3.7 mg/dL — ABNORMAL HIGH (ref 0.3–0.9)
TOTAL PROTEIN: 4.3 g/dL — AB (ref 6.5–8.1)
Total Bilirubin: 11.8 mg/dL — ABNORMAL HIGH (ref 0.3–1.2)

## 2016-09-19 LAB — CBC
HEMATOCRIT: 29.5 % — AB (ref 36.0–46.0)
HEMOGLOBIN: 10.5 g/dL — AB (ref 12.0–15.0)
MCH: 32.4 pg (ref 26.0–34.0)
MCHC: 35.6 g/dL (ref 30.0–36.0)
MCV: 91 fL (ref 78.0–100.0)
Platelets: 276 10*3/uL (ref 150–400)
RBC: 3.24 MIL/uL — AB (ref 3.87–5.11)
RDW: 13.5 % (ref 11.5–15.5)
WBC: 8.4 10*3/uL (ref 4.0–10.5)

## 2016-09-19 LAB — ANTINUCLEAR ANTIBODIES, IFA: ANA Ab, IFA: NEGATIVE

## 2016-09-19 LAB — HCV RNA QUANT: HCV QUANT: NOT DETECTED [IU]/mL (ref >50)

## 2016-09-19 NOTE — Progress Notes (Signed)
Ultrasound does not show any change in size of bile duct. Pancreas is very well seen and no pancreatic masses either. I still feel patient has obstructive jaundice. She certainly could have infiltrating process involving the biliary system. Next step in her workup would be endoscopic ultrasound with ERCP stenting at the same time if indicated. I have contacted Dr. Alyse Lowodd Barron's office and talked with Herbert SetaHeather and also with Lonia MadNatalie,RN who works with Dr. Corliss Parishodd Baron. Also discussed case with Dr. Zane HeraldAdam Sasaki of internal medicine who would be accepting the patient in transfer. Will ask radiology to release imaging studies while power share.  Patient's condition discussed with POA Hazel([phone]).

## 2016-09-19 NOTE — Clinical Social Work Note (Signed)
[facility] care with EUS and ERCP with stenting.  LCSW signing off.       Kewanna Kasprzak, Juleen ChinaHeather D, LCSW

## 2016-09-19 NOTE — Care Management Obs Status (Signed)
MEDICARE OBSERVATION STATUS NOTIFICATION   Patient Details  Name: Lisa [name] MRN: [id number] Date of Birth: 11/26/1923   Medicare Observation Status Notification Given:  Yes    Joretta Eads, Chrystine OilerSharley Diane, RN 09/19/2016, 12:23 PM

## 2016-09-19 NOTE — Discharge Summary (Signed)
Physician Discharge Summary  Lisa [name] MLY:[id number] DOB: 11/08/1923 DOA: 09/16/2016  PCP: System, Pcp Not In  Admit date: 09/16/2016 Discharge date: 09/19/2016  Time spent: 45 minutes  Recommendations for Outpatient Follow-up:  -Will be transferred to [facility] for continued GI care with EUS and ERCP with stenting.   Discharge Diagnoses:  Principal Problem:   Abdominal pain Active Problems:   Elevated LFTs   Weakness   FTT (failure to thrive) in adult   CHF (congestive heart failure) (HCC)   Mental retardation   Discharge Condition: Stable  Filed Weights   09/16/16 1828 09/17/16 0359 09/18/16 0352  Weight: 48.1 kg (106 lb) 51.9 kg (114 lb 8 oz) 52 kg (114 lb 10.2 oz)    History of present illness:  As per Dr. Shanon [name] on 9/8[name] is a 83 y.o. female with medical history significant of mental retardation, CHF, HTN lives in a group home who has been sent to ED twice today for "fall".  Pt cannot provide any useful history.  It was reported she has not been eating for several days and has fallen twice.  Pt denies any abdominal pain or feeling ill.  I think her history is unreliable.  She appears jaundiced.  She looks pleasant and comfortable.  Pt referred for admission for concerns for acute cholecystitis with elevated lfts, alk phos and bili levels.  Hospital Course:   Abnormal LFTs -GI still believes patient has obstructive jaundice. -Ultrasound, CT scan and MRCP did not show evidence of bile duct dilatation. -Repeat US 9/10 does not show any change in the size of the bile duct. -Viral hepatitis markers are negative. -Workup for autoimmune hepatitis has been requested including SMA. Sed rate is low arguing against an autoimmune process. -Dr. Laural Golden as made arrangements to have patient transferred to [facility] today for EUS with ERCP and stenting if indicated.  Abdominal pain -Resolved, suspected due to above.  Dysphagia -Seen by ST today with recommendations for  a D3, thin liquid diet.  History of mental retardation -Noted   Procedures:  None   Consultations:  GI, Dr. Laural Golden  Discharge Instructions  Discharge Instructions    Increase activity slowly    Complete by:  As directed      Allergies as of 09/19/2016      Reactions   Advil [ibuprofen]    Aspirin    Naprosyn [naproxen]       Medication List    STOP taking these medications   amLODipine-benazepril 5-20 MG capsule Commonly known as:  LOTREL   metoprolol tartrate 25 MG tablet Commonly known as:  LOPRESSOR   oyster calcium 500 MG Tabs tablet   pantoprazole 40 MG tablet Commonly known as:  PROTONIX   PRESERVISION/LUTEIN PO   sulfamethoxazole-trimethoprim 800-160 MG tablet Commonly known as:  BACTRIM DS,SEPTRA DS            Discharge Care Instructions        Start     Ordered   09/19/16 0000  Increase activity slowly     09/19/16 1415     Allergies  Allergen Reactions  . Advil [Ibuprofen]   . Aspirin   . Naprosyn [Naproxen]       The results of significant diagnostics from this hospitalization (including imaging, microbiology, ancillary and laboratory) are listed below for reference.    Significant Diagnostic Studies: Dg Pelvis 1-2 Views  Result Date: 09/16/2016 CLINICAL DATA:  Fall EXAM: PELVIS - 1-2 VIEW COMPARISON:  None. FINDINGS: Osteopenia.  SI joint arthritis. Pubic symphysis and rami appear intact. Vascular calcifications. Advanced arthritis at the right hip with chronic deformity of the right femoral head. Possible step-off deformity at the right femoral head neck junction. Moderate degenerative changes of the left hip IMPRESSION: 1. Advanced arthritis of the right hip with chronic deformity of the right femoral head. Questionable step-off deformity at the right femoral head neck junction, suggest CT for further evaluation. Electronically Signed   By: Donavan Foil M.D.   On: 09/16/2016 19:39   Dg Tibia/fibula Right  Result Date:  09/16/2016 CLINICAL DATA:  Initial evaluation for acute trauma, fall. EXAM: RIGHT TIBIA AND FIBULA - 2 VIEW COMPARISON:  None. FINDINGS: No acute fracture or dislocation. Bones are diffusely osteopenic. Degenerative changes noted about the knee and ankle. No acute soft tissue abnormality. Prominent vascular calcifications. IMPRESSION: No acute osseous abnormality about the right tibia/fibula. Electronically Signed   By: Jeannine Boga M.D.   On: 09/16/2016 19:55   Ct Head Wo Contrast  Result Date: 09/16/2016 CLINICAL DATA:  Head trauma. [place] out of bed last night. Lightheadedness and dizziness. Initial encounter. EXAM: CT HEAD WITHOUT CONTRAST TECHNIQUE: Contiguous axial images were obtained from the base of the skull through the vertex without intravenous contrast. COMPARISON:  None. FINDINGS: Brain: A 4 mm hyperdense focus in the right corona radiata has attenuation consistent with calcification. There is no evidence of acute cortical infarct, intracranial hemorrhage, midline shift, or extra-axial fluid collection. Mild generalized cerebral atrophy is within normal limits for age. Periventricular white matter hypoattenuation is nonspecific but compatible with mild chronic small vessel ischemic disease. Lacunar infarcts are present in the basal ganglia bilaterally. There is an 8 mm focus of calcification over the left parietal convexity without mass effect on the underlying brain, possibly a calcified meningioma and not felt to be of any clinical significance. Vascular: Calcified atherosclerosis at the skullbase. No hyperdense vessel. Skull: No fracture. Sclerosis in the right greater sphenoid wing without aggressive features, possibly fibrous dysplasia. Sinuses/Orbits: Minimal scattered paranasal sinus mucosal thickening clear mastoid air cells. Bilateral cataract extraction. Other: None. IMPRESSION: 1. No evidence of acute intracranial hemorrhage. 2. Age indeterminate lacunar infarcts in the basal ganglia  bilaterally. 3. Mild chronic small vessel ischemic disease. 4. Possible 8 mm left parietal meningioma. Electronically Signed   By: Logan Bores M.D.   On: 09/16/2016 19:50   US Abdomen Complete  Result Date: 09/17/2016 CLINICAL DATA:  83 year old female with abdominal pain, transaminitis and elevated bilirubin EXAM: ABDOMEN ULTRASOUND COMPLETE COMPARISON:  CT scan of the abdomen and pelvis 09/16/2016 FINDINGS: Gallbladder: The gallbladder is partially decompressed. Mildly striated and thickened gallbladder wall measuring 3-4 mm in diameter. No evidence of cholelithiasis. There is trace pericholecystic fluid. Per the sonographer, the sonographic Percell Miller sign was negative. Common bile duct: Diameter: Within normal limits at 5-6 mm Liver: No focal lesion identified. Within normal limits in parenchymal echogenicity. Portal vein is patent on color Doppler imaging with normal direction of blood flow towards the liver. IVC: No abnormality visualized. Pancreas: Visualized portion unremarkable. Spleen: Size and appearance within normal limits. Right Kidney: Length: 10.0 cm. Mildly echogenic renal parenchyma with increased differentiation of the corticomedullary junction. Small anechoic cystic structure within imperceptible margin measuring 0.9 cm in the upper pole consistent with a simple cyst. Left Kidney: Length: 11.1 cm. Mildly echogenic renal parenchyma. Small anechoic cystic structure within imperceptible margin in the lower pole measures up to 1.2 cm consistent with a small simple cyst. Abdominal aorta: No aneurysm  visualized. Other findings: None. IMPRESSION: 1. Nonspecific findings of gallbladder wall thickening and stripe a shin as well as trace pericholecystic fluid without associated cholelithiasis or gallbladder distension. No sonographic Percell Miller sign although this sign is less reliable in the elderly population. Differential considerations include acalculous cholecystitis, and gallbladder wall thickening  related to intrinsic liver disease or hypoalbuminemia. 2. Normal diameter of the common bile duct. 3. Echogenic kidneys bilaterally suggests underlying medical renal disease. 4. Bilateral simple renal cysts. Electronically Signed   By: Jacqulynn Cadet M.D.   On: 09/17/2016 10:16   Ct Abdomen Pelvis W Contrast  Result Date: 09/17/2016 CLINICAL DATA:  Patient fell off of the head. Patient was seen this morning for a fall as well. Unspecified abdominal pain. EXAM: CT ABDOMEN AND PELVIS WITH CONTRAST TECHNIQUE: Multidetector CT imaging of the abdomen and pelvis was performed using the standard protocol following bolus administration of intravenous contrast. CONTRAST:  50m ISOVUE-300 IOPAMIDOL (ISOVUE-300) INJECTION 61% COMPARISON:  CT right hip 09/16/2016 FINDINGS: Lower chest: Motion artifact limits evaluation. Probable atelectasis and scarring in the lung bases. Calcification of the aorta, mitral valve, and coronary arteries. Hepatobiliary: No focal liver abnormality is seen. No gallstones, gallbladder wall thickening, or biliary dilatation. Pancreas: Unremarkable. No pancreatic ductal dilatation or surrounding inflammatory changes. Spleen: Normal in size without focal abnormality. Adrenals/Urinary Tract: No adrenal gland nodules. Subcentimeter cysts in the kidneys. No hydronephrosis. Nephrograms are symmetrical. Bladder is moderately distended without wall thickening. Stomach/Bowel: Stomach, small bowel, and colon are mostly decompressed. Scattered stool in the colon. Appendix is not identified. Vascular/Lymphatic: Aortic atherosclerosis. No enlarged abdominal or pelvic lymph nodes. Prominent calcification at the origin of the celiac axis, renal artery origins, origin of the right iliac artery, and in the proximal/mid superior mesenteric artery. Vascular stenosis is likely. Vessels are patent. Reproductive: Uterus is not enlarged. Bilobed cystic structure demonstrated in the left adnexal region probably  representing ovarian cyst. This measures about 2.1 x 3.9 cm in diameter. Ultrasound follow-up at 6-12 months is recommended. Other: No free air or free fluid in the abdomen. Abdominal wall musculature appears intact. Fatty atrophy of the gluteal muscles and paraspinal muscles. Infiltration in the subcutaneous fat over the left hip probably represents soft tissue contusion. This could represent a lipoma. Musculoskeletal: Degenerative changes throughout the lumbar spine. Mild anterior subluxation of L4 on L5 is likely degenerative. Prominent degenerative changes in the hips. No acute fractures are suggested. IMPRESSION: 1. No definite acute process demonstrated in the abdomen or pelvis. No evidence of bowel obstruction or inflammation. 2. Diffuse aortic atherosclerosis. Prominent calcification and multiple branch vessels may result in stenosis. Vessels appear patent. Coronary artery calcification. 3. Left adnexal cyst measuring 3.9 cm maximal diameter. Ultrasound follow-up at 6-12 months is recommended. 4. Degenerative changes in the lumbar spine and hips. Electronically Signed   By: WLucienne CapersM.D.   On: 09/17/2016 00:00   Ct Hip Right Wo Contrast  Result Date: 09/16/2016 CLINICAL DATA:  Right hip pain after fall. EXAM: CT OF THE RIGHT HIP WITHOUT CONTRAST TECHNIQUE: Multidetector CT imaging of the right hip was performed according to the standard protocol. Multiplanar CT image reconstructions were also generated. COMPARISON:  Radiograph earlier this day. FINDINGS: Bones/Joint/Cartilage No acute fracture. Advanced degenerative change of the right hip with complete joint space loss, flattening of femoral head and subchondral cystic change and scleroses. Large femoral head neck osteophytes creating the appearance step-off on radiograph. No evidence to suggest underlying avascular necrosis. The pubic rami are intact. Included right  iliac bone and sacrum are intact. No hip joint effusion. Ligaments Suboptimally  assessed by CT. Muscles and Tendons Evidence intramuscular hematoma. Soft tissues Faint soft tissue edema posteriorly without confluent hematoma. Advanced atherosclerosis. Large stool burden in the ascending colon is incidentally noted. IMPRESSION: Advanced degenerative change of the right hip without acute fracture. Electronically Signed   By: Jeb Levering M.D.   On: 09/16/2016 22:30   Mr Abdomen Mrcp Wo Contrast  Result Date: 09/18/2016 CLINICAL DATA:  Patient with abnormal LFTs. Jaundice. Abdominal pain. EXAM: MRI ABDOMEN WITHOUT CONTRAST  (INCLUDING MRCP) TECHNIQUE: Multiplanar multisequence MR imaging of the abdomen was performed. Heavily T2-weighted images of the biliary and pancreatic ducts were obtained, and three-dimensional MRCP images were rendered by post processing. COMPARISON:  CT abdomen pelvis 09/16/2016; abdominal ultrasound 09/17/2016. FINDINGS: Markedly limited exam due to motion artifact and patient's inability to breath hold. Lower chest: Small bilateral pleural effusions. Probable dependent atelectasis bilateral lower lobes. Cardiomegaly. Hepatobiliary: The liver is normal in size and contour. There is wall thickening of the gallbladder measuring up to 4 mm. Small amount of pericholecystic fluid. No definite filling defects identified within the gallbladder lumen. No definite intrahepatic or extrahepatic biliary ductal dilatation. Common bile duct measures 4 mm. MRCP images nondiagnostic due to motion artifact. Pancreas:  Unremarkable Spleen:  Unremarkable Adrenals/Urinary Tract: Normal adrenal glands. Kidneys are symmetric in size. No hydronephrosis. 10 mm cyst interpolar region right kidney. 10 mm cyst interpolar region left kidney. Stomach/Bowel: Visualized large and small bowel is unremarkable. No evidence for bowel obstruction. Vascular/Lymphatic: Normal caliber abdominal aorta. No retroperitoneal lymphadenopathy. Other:  There is a 4.1 cm left adnexal cystic lesion.  Musculoskeletal: Mild third spacing of fluid. No aggressive or acute appearing osseous lesions. IMPRESSION: Markedly limited exam secondary to motion artifact from patient's inability to breath hold. There is mild gallbladder wall thickening and pericholecystic fluid. The few visualized segments of the common bile duct are normal in diameter. No definite intraluminal filling defect identified although the majority of the common bile duct is not adequately assessed due to motion artifact. Findings are nonspecific however acalculous cholecystitis is a consideration. 4.1 cm left adnexal cystic lesion. In the nonacute setting, recommend further evaluation with pelvic ultrasound. These results were called by telephone at the time of interpretation on 09/18/2016 at 12:29 pm to Dr. Hildred Laser , who verbally acknowledged these results. Electronically Signed   By: Lovey Newcomer M.D.   On: 09/18/2016 12:36   Mr 3d Recon At Scanner  Result Date: 09/18/2016 CLINICAL DATA:  Patient with abnormal LFTs. Jaundice. Abdominal pain. EXAM: MRI ABDOMEN WITHOUT CONTRAST  (INCLUDING MRCP) TECHNIQUE: Multiplanar multisequence MR imaging of the abdomen was performed. Heavily T2-weighted images of the biliary and pancreatic ducts were obtained, and three-dimensional MRCP images were rendered by post processing. COMPARISON:  CT abdomen pelvis 09/16/2016; abdominal ultrasound 09/17/2016. FINDINGS: Markedly limited exam due to motion artifact and patient's inability to breath hold. Lower chest: Small bilateral pleural effusions. Probable dependent atelectasis bilateral lower lobes. Cardiomegaly. Hepatobiliary: The liver is normal in size and contour. There is wall thickening of the gallbladder measuring up to 4 mm. Small amount of pericholecystic fluid. No definite filling defects identified within the gallbladder lumen. No definite intrahepatic or extrahepatic biliary ductal dilatation. Common bile duct measures 4 mm. MRCP images  nondiagnostic due to motion artifact. Pancreas:  Unremarkable Spleen:  Unremarkable Adrenals/Urinary Tract: Normal adrenal glands. Kidneys are symmetric in size. No hydronephrosis. 10 mm cyst interpolar region right kidney. 10 mm  cyst interpolar region left kidney. Stomach/Bowel: Visualized large and small bowel is unremarkable. No evidence for bowel obstruction. Vascular/Lymphatic: Normal caliber abdominal aorta. No retroperitoneal lymphadenopathy. Other:  There is a 4.1 cm left adnexal cystic lesion. Musculoskeletal: Mild third spacing of fluid. No aggressive or acute appearing osseous lesions. IMPRESSION: Markedly limited exam secondary to motion artifact from patient's inability to breath hold. There is mild gallbladder wall thickening and pericholecystic fluid. The few visualized segments of the common bile duct are normal in diameter. No definite intraluminal filling defect identified although the majority of the common bile duct is not adequately assessed due to motion artifact. Findings are nonspecific however acalculous cholecystitis is a consideration. 4.1 cm left adnexal cystic lesion. In the nonacute setting, recommend further evaluation with pelvic ultrasound. These results were called by telephone at the time of interpretation on 09/18/2016 at 12:29 pm to Dr. Hildred Laser , who verbally acknowledged these results. Electronically Signed   By: Lovey Newcomer M.D.   On: 09/18/2016 12:36   US Abdomen Limited Ruq  Result Date: 09/19/2016 CLINICAL DATA:  Jaundice, increasing bilirubin since prior ultrasound, nondiagnostic MRCP EXAM: ULTRASOUND ABDOMEN LIMITED RIGHT UPPER QUADRANT COMPARISON:  09/17/2016 FINDINGS: Gallbladder: Persistent significant gallbladder wall thickening. Gallbladder less distended than on previous exam. Internal scattered echoes/debris. No definite shadowing calculi or sonographic Murphy sign. Common bile duct: Diameter: 5 mm diameter, previously 6 mm Liver: No focal lesion identified.  Within normal limits in parenchymal echogenicity. Portal vein is patent on color Doppler imaging with normal direction of blood flow towards the liver. No RIGHT upper quadrant free fluid. Incidentally noted pancreatic duct 3 mm diameter upper normal. Incidentally noted tiny RIGHT renal cyst 11 mm diameter. IMPRESSION: Persistent significant gallbladder wall thickening without definite shadowing calculi or sonographic Murphy sign. Stable normal caliber CBD 5 mm diameter. Electronically Signed   By: Lavonia Dana M.D.   On: 09/19/2016 11:08    Microbiology: Recent Results (from the past 240 hour(s))  MRSA PCR Screening     Status: None   Collection Time: 09/17/16  2:20 AM  Result Value Ref Range Status   MRSA by PCR NEGATIVE NEGATIVE Final    Comment:        The GeneXpert MRSA Assay (FDA approved for NASAL specimens only), is one component of a comprehensive MRSA colonization surveillance program. It is not intended to diagnose MRSA infection nor to guide or monitor treatment for MRSA infections.      Labs: Basic Metabolic Panel:  Recent Labs Lab 09/16/16 1941 09/17/16 0606  NA 130* 132*  K 4.5 3.6  CL 100* 104  CO2 21* 20*  GLUCOSE 138* 83  BUN 22* 15  CREATININE 0.99 0.69  CALCIUM 8.5* 7.9*   Liver Function Tests:  Recent Labs Lab 09/16/16 1941 09/17/16 0606 09/18/16 0555 09/19/16 0433  AST 464* 429* 448* 502*  ALT 817* 793* 773* 827*  ALKPHOS 192* 163* 167* 195*  BILITOT 5.8* 6.1* 8.2* 11.8*  PROT 5.1* 4.7* 4.2* 4.3*  ALBUMIN 3.1* 2.7* 2.4* 2.3*    Recent Labs Lab 09/16/16 2257 09/17/16 0606  LIPASE 95* 90*   No results for input(s): AMMONIA in the last 168 hours. CBC:  Recent Labs Lab 09/16/16 1029 09/17/16 0606 09/19/16 0433  WBC 7.8 6.1 8.4  HGB 14.2 11.1* 10.5*  HCT 39.6 31.4* 29.5*  MCV 91.2 91.3 91.0  PLT 234 225 276   Cardiac Enzymes:  Recent Labs Lab 09/16/16 1029  TROPONINI <0.03   BNP: BNP (last  3 results) No results for  input(s): BNP in the last 8760 hours.  ProBNP (last 3 results) No results for input(s): PROBNP in the last 8760 hours.  CBG: No results for input(s): GLUCAP in the last 168 hours.     SignedLelon Frohlich  [facility] Pager: [phone] 09/19/2016, 2:15 PM

## 2016-09-19 NOTE — Progress Notes (Signed)
  Subjective:  Patient has no complaints. She denies nausea vomiting abdominal pain. She is hungry.  Objective: Blood pressure 128/61, pulse 82, temperature 98.7 F (37.1 C), temperature source Oral, resp. rate 16, height 5\' 4"  (1.626 m), weight 114 lb 10.2 oz (52 kg), SpO2 94 %.  Patient is alert and in no acute distress. She responds appropriately to questions. She remains jaundiced. Abdomen is flat soft and nontender without organomegaly or masses.  Labs/studies Results:   Recent Labs  09/16/16 1029 09/17/16 0606 09/19/16 0433  WBC 7.8 6.1 8.4  HGB 14.2 11.1* 10.5*  HCT 39.6 31.4* 29.5*  PLT 234 225 276    BMET   Recent Labs  09/16/16 1941 09/17/16 0606  NA 130* 132*  K 4.5 3.6  CL 100* 104  CO2 21* 20*  GLUCOSE 138* 83  BUN 22* 15  CREATININE 0.99 0.69  CALCIUM 8.5* 7.9*    LFT   Recent Labs  09/17/16 0606 09/18/16 0555 09/19/16 0433  PROT 4.7* 4.2* 4.3*  ALBUMIN 2.7* 2.4* 2.3*  AST 429* 448* 502*  ALT 793* 773* 827*  ALKPHOS 163* 167* 195*  BILITOT 6.1* 8.2* 11.8*  BILIDIR  --  5.8* 8.1*  IBILI  --  2.4* 3.7*    PT/INR   Recent Labs  09/18/16 0555  LABPROT 15.7*  INR 1.26    Hepatitis Panel   Recent Labs  09/16/16 2257  HEPBSAG Negative  HCVAB <0.1  HEPAIGM Negative  HEPBIGM Negative    Sedimentation rate is 7 ANA, SMA and HCV RNA by PCR pending.   Assessment:  #1. Jaundice. Bilirubin has doubled since admission. Transaminases remain elevated. Suspect biliary obstruction in spite of -3 imaging studies. Meanwhile patient remains asymptomatic.  #2. Oropharyngeal dysphagia. She is scheduled for modified barium study today. If she needs an ERCP the study may have to be postponed.  #3. Anemia. No evidence of GI bleed. Drop in H/H secondary to hydration. She may have anemia of chronic disease.   Recommendations:  Will proceed with limited right upper quadrant ultrasound today. If bile duct diameter has increased Will proceed  with diagnostic and therapeutic ERCP.

## 2016-09-19 NOTE — Progress Notes (Signed)
  Speech Language Pathology Treatment: Dysphagia  Patient Details Name: Lisa [name] MRN: [id number] DOB: 1923/03/13 Today's Date: 09/19/2016 Time: 1300-1330 SLP Time Calculation (min) (ACUTE ONLY): 30 min  Assessment / Plan / Recommendation Clinical Impression  Pt seen at bedside for ongoing diagnostic dysphagia intervention. Pt appears significantly improved from Saturday. She was assessed with ice chips, thin water via cup/straw, puree, mech soft, and regular textures all self presented. Pt consumed over 475m water and 6 oz solids without overt signs or symptoms of aspiration. Suspect that issues on Saturday may have been related to previous nausea related to GI involvement. Pt has not eaten in several days and although she is NOT showing s/sx aspiration, she will need to be monitored as she advances to a diet. [facility] Recommend D3/mech soft and thin liquids with standard aspiration and reflux precautions. PO meds whole with water or puree per pt preference.    HPI HPI: Lisa [name] a 83y.o. female with medical history significant of mental retardation, CHF, HTN lives in a group home who has been sent to ED twice today for "fall".  Pt cannot provide any useful history.  It was reported she has not been eating for several days and has fallen twice.  Pt denies any abdominal pain or feeling ill.  I think her history is unreliable.  She appears jaundiced.  She looks pleasant and comfortable.  Pt referred for admission for concerns for acute cholecystitis with elevated lfts, alk phos and bili levels.      SLP Plan  Continue with current plan of care       Recommendations  Diet recommendations: Dysphagia 3 (mechanical soft);Thin liquid Liquids provided via: Cup;Straw Medication Administration: Whole meds with liquid Supervision: Patient able to self feed;Intermittent supervision to cue for compensatory strategies Postural Changes and/or Swallow  Maneuvers: Seated upright 90 degrees;Upright 30-60 min after meal                Oral Care Recommendations: Oral care BID;Staff/trained caregiver to provide oral care Follow up Recommendations: 24 hour supervision/assistance SLP Visit Diagnosis: Dysphagia, unspecified (R13.10) Plan: Continue with current plan of care       Thank you,  DGenene Churn CEbony               PB[place]9/10/2016, 1:38 PM

## 2016-09-20 LAB — ANTI-SMOOTH MUSCLE ANTIBODY, IGG: F-Actin IgG: 8 Units (ref 0–19)

## 2016-09-20 LAB — HEPATIC FUNCTION PANEL
ALT: 876 U/L — AB (ref 14–54)
AST: 545 U/L — AB (ref 15–41)
Albumin: 2.2 g/dL — ABNORMAL LOW (ref 3.5–5.0)
Alkaline Phosphatase: 235 U/L — ABNORMAL HIGH (ref 38–126)
BILIRUBIN DIRECT: 9.3 mg/dL — AB (ref 0.1–0.5)
BILIRUBIN TOTAL: 14.3 mg/dL — AB (ref 0.3–1.2)
Indirect Bilirubin: 5 mg/dL — ABNORMAL HIGH (ref 0.3–0.9)
Total Protein: 4.2 g/dL — ABNORMAL LOW (ref 6.5–8.1)

## 2016-09-20 NOTE — Progress Notes (Signed)
Patient briefly seen and examined. No new complaints. VS reviewed and stable. Not in distress. Is awaiting bed for transfer to [facility] for EUS and ERCP with stenting, if indicated. No changes necessary to DC Summary dated 9/10.  Peggye PittEstela Hernandez, MD [facility] Pager: [phone]

## 2016-09-20 NOTE — Care Management Important Message (Signed)
Important Message  Patient Details  Name: Lisa [name] MRN: [id number] Date of Birth: 1923-04-23   Medicare Important Message Given:  Yes    Esaul Dorwart, Chrystine OilerSharley Diane, RN 09/20/2016, 8:55 AM

## 2016-09-21 DIAGNOSIS — L8990 Pressure ulcer of unspecified site, unspecified stage: Secondary | ICD-10-CM | POA: Insufficient documentation

## 2016-09-21 DIAGNOSIS — F79 Unspecified intellectual disabilities: Secondary | ICD-10-CM

## 2016-09-21 DIAGNOSIS — R627 Adult failure to thrive: Secondary | ICD-10-CM

## 2016-09-21 LAB — PROTIME-INR
INR: 1.07
PROTHROMBIN TIME: 13.8 s (ref 11.4–15.2)

## 2016-09-21 LAB — HEPATIC FUNCTION PANEL
ALBUMIN: 2.3 g/dL — AB (ref 3.5–5.0)
ALT: 1190 U/L — ABNORMAL HIGH (ref 14–54)
AST: 709 U/L — AB (ref 15–41)
Alkaline Phosphatase: 256 U/L — ABNORMAL HIGH (ref 38–126)
BILIRUBIN DIRECT: 12.3 mg/dL — AB (ref 0.1–0.5)
Indirect Bilirubin: 5.8 mg/dL — ABNORMAL HIGH (ref 0.3–0.9)
Total Bilirubin: 18.1 mg/dL (ref 0.3–1.2)
Total Protein: 4.7 g/dL — ABNORMAL LOW (ref 6.5–8.1)

## 2016-09-21 MED ORDER — PIPERACILLIN-TAZOBACTAM 3.375 G IVPB
3.3750 g | Freq: Three times a day (TID) | INTRAVENOUS | Status: DC
  Administered 2016-09-21 – 2016-09-22 (×2): 3.375 g via INTRAVENOUS
  Filled 2016-09-21 (×2): qty 50

## 2016-09-21 MED ORDER — PIPERACILLIN-TAZOBACTAM 3.375 G IVPB 30 MIN: 3.3750 g | Freq: Three times a day (TID) | INTRAVENOUS | Status: DC

## 2016-09-21 NOTE — Progress Notes (Signed)
CRITICAL VALUE ALERT  Critical Value:  Total Bilirubin 18.1  Date & Time Notied:  09/21/16 0955  Provider Notified: Dr. Arbutus Leasat  Orders Received/Actions taken:

## 2016-09-21 NOTE — Progress Notes (Addendum)
PROGRESS NOTE  Lisa [name] ZOX:[id number] DOB: 11-May-1923 DOA: 09/16/2016 PCP: System, Pcp Not In  Brief History:  83 year old female with a history of CHF, mental retardation, hypertension presented after a mechanical fall at her group home. Apparently, the patient was slumped over the edge of the bed upon EMS arrival. She was noted to have elevated LFTs and bilirubin. CT of the abdomen and pelvis was performed on 09/16/2016 which was essentially unremarkable. GI was consulted.  09/17/2016 abdominal ultrasound showed gallbladder wall thickening with trace pericholecystic fluid and echogenic kidneys. MRCP was obtained and showed mild gallbladder wall thickening with pericholecystic fluid with normal CBD diameter, but there was significant motion artifact. Repeat Abdominal ultrasound on 09/19/2016 showed significant gallbladder wall thickening but less prominent with common bile duct measuring 5 mm. The patient's LFTs have gradually increased. The patient has remained hemodynamically stable and afebrile. [facility]. However, there was lack of bed availability.  Assessment/Plan: Transaminasemia -cholestatic and hepatocellular pattern -viral hepatitis serologies negative -ANA, ASMA are negative -HCV RNA negative -MRCP and abd US as noted above -appreciate GI eval -restart zosyn -awaiting transfer [facility] for EUS/ERCP -am CBC, CMP  Essential hypertension -Amlodipine, benazepril, metoprolol discontinued -Blood pressure has remained acceptable  Abdominal pain -resolved  Dysphagia -evaluated by speech-->dysphagia 3 diet with thin liquids  Mental Retardation -stable  Chronic CHF -euvolemic clinically -systolic vs diastolic not specified -echo        Disposition Plan:   [facility] Family Communication:   POA updated on phone 9/12  Consultants:  GI  Code Status:  FULL   DVT Prophylaxis:   SCD   Procedures: As Listed in Progress Note Above  Antibiotics: None    Subjective: Patient denies fevers, chills, headache, chest pain, dyspnea, nausea, vomiting, diarrhea, abdominal pain, dysuria   Objective: Vitals:   09/20/16 2119 09/21/16 0500 09/21/16 0600 09/21/16 1526  BP: 137/61  (!) 147/59 (!) 141/57  Pulse:   95 88  Resp: 18  18 20   Temp: 98.3 F (36.8 C)  98 F (36.7 C) 98.2 F (36.8 C)  TempSrc: Oral  Oral Oral  SpO2: 99%  99% 99%  Weight:  50.8 kg (112 lb)    Height:        Intake/Output Summary (Last 24 hours) at 09/21/16 1704 Last data filed at 09/21/16 0600  Gross per 24 hour  Intake                0 ml  Output              250 ml  Net             -250 ml   Weight change: 0.726 kg (1 lb 9.6 oz) Exam:   General:  Pt is alert, follows commands appropriately, not in acute distress  HEENT: No icterus, No thrush, No neck mass, [place]/AT  Cardiovascular: RRR, S1/S2, no rubs, no gallops  Respiratory: CTA bilaterally, no wheezing, no crackles, no rhonchi  Abdomen: Soft/+BS, non tender, non distended, no guarding  Extremities: Trace LE edema, No lymphangitis, No petechiae, No rashes, no synovitis   Data Reviewed: I have personally reviewed following labs and imaging studies Basic Metabolic Panel:  Recent Labs Lab 09/16/16 1941 09/17/16 0606  NA 130* 132*  K 4.5 3.6  CL 100* 104  CO2 21* 20*  GLUCOSE 138* 83  BUN 22* 15  CREATININE 0.99 0.69  CALCIUM 8.5* 7.9*   Liver Function Tests:  Recent Labs Lab 09/17/16 0606 09/18/16 0555 09/19/16 0433 09/20/16 0631 09/21/16 0833  AST 429* 448* 502* 545* 709*  ALT 793* 773* 827* 876* 1,190*  ALKPHOS 163* 167* 195* 235* 256*  BILITOT 6.1* 8.2* 11.8* 14.3* 18.1*  PROT 4.7* 4.2* 4.3* 4.2* 4.7*  ALBUMIN 2.7* 2.4* 2.3* 2.2* 2.3*    Recent Labs Lab 09/16/16 2257 09/17/16 0606  LIPASE 95* 90*   No results for input(s): AMMONIA in the last 168 hours. Coagulation Profile:  Recent  Labs Lab 09/18/16 0555 09/21/16 0833  INR 1.26 1.07   CBC:  Recent Labs Lab 09/16/16 1029 09/17/16 0606 09/19/16 0433  WBC 7.8 6.1 8.4  HGB 14.2 11.1* 10.5*  HCT 39.6 31.4* 29.5*  MCV 91.2 91.3 91.0  PLT 234 225 276   Cardiac Enzymes:  Recent Labs Lab 09/16/16 1029  TROPONINI <0.03   BNP: Invalid input(s): POCBNP CBG: No results for input(s): GLUCAP in the last 168 hours. HbA1C: No results for input(s): HGBA1C in the last 72 hours. Urine analysis:    Component Value Date/Time   COLORURINE YELLOW 09/16/2016 1006   APPEARANCEUR CLEAR 09/16/2016 1006   LABSPEC 1.005 09/16/2016 1006   PHURINE 6.0 09/16/2016 1006   GLUCOSEU NEGATIVE 09/16/2016 1006   HGBUR NEGATIVE 09/16/2016 1006   BILIRUBINUR NEGATIVE 09/16/2016 1006   KETONESUR NEGATIVE 09/16/2016 1006   PROTEINUR NEGATIVE 09/16/2016 1006   UROBILINOGEN 0.2 06/10/2010 1717   NITRITE NEGATIVE 09/16/2016 1006   LEUKOCYTESUR NEGATIVE 09/16/2016 1006   Sepsis Labs: (procalcitonin:4,lacticidven:4) ) Recent Results (from the past 240 hour(s))  MRSA PCR Screening     Status: None   Collection Time: 09/17/16  2:20 AM  Result Value Ref Range Status   MRSA by PCR NEGATIVE NEGATIVE Final    Comment:        The GeneXpert MRSA Assay (FDA approved for NASAL specimens only), is one component of a comprehensive MRSA colonization surveillance program. It is not intended to diagnose MRSA infection nor to guide or monitor treatment for MRSA infections.      Scheduled Meds: Continuous Infusions: . sodium chloride 50 mL/hr at 09/21/16 1550  . piperacillin-tazobactam      Procedures/Studies: Dg Pelvis 1-2 Views  Result Date: 09/16/2016 CLINICAL DATA:  Fall EXAM: PELVIS - 1-2 VIEW COMPARISON:  None. FINDINGS: Osteopenia. SI joint arthritis. Pubic symphysis and rami appear intact. Vascular calcifications. Advanced arthritis at the right hip with chronic deformity of the right femoral head. Possible  step-off deformity at the right femoral head neck junction. Moderate degenerative changes of the left hip IMPRESSION: 1. Advanced arthritis of the right hip with chronic deformity of the right femoral head. Questionable step-off deformity at the right femoral head neck junction, suggest CT for further evaluation. Electronically Signed   By: Jasmine Pang M.D.   On: 09/16/2016 19:39   Dg Tibia/fibula Right  Result Date: 09/16/2016 CLINICAL DATA:  Initial evaluation for acute trauma, fall. EXAM: RIGHT TIBIA AND FIBULA - 2 VIEW COMPARISON:  None. FINDINGS: No acute fracture or dislocation. Bones are diffusely osteopenic. Degenerative changes noted about the knee and ankle. No acute soft tissue abnormality. Prominent vascular calcifications. IMPRESSION: No acute osseous abnormality about the right tibia/fibula. Electronically Signed   By: Rise Mu M.D.   On: 09/16/2016 19:55   Ct Head Wo Contrast  Result Date: 09/16/2016 CLINICAL DATA:  Head trauma. Larey Seat out of bed last night. Lightheadedness and dizziness. Initial  encounter. EXAM: CT HEAD WITHOUT CONTRAST TECHNIQUE: Contiguous axial images were obtained from the base of the skull through the vertex without intravenous contrast. COMPARISON:  None. FINDINGS: Brain: A 4 mm hyperdense focus in the right corona radiata has attenuation consistent with calcification. There is no evidence of acute cortical infarct, intracranial hemorrhage, midline shift, or extra-axial fluid collection. Mild generalized cerebral atrophy is within normal limits for age. Periventricular white matter hypoattenuation is nonspecific but compatible with mild chronic small vessel ischemic disease. Lacunar infarcts are present in the basal ganglia bilaterally. There is an 8 mm focus of calcification over the left parietal convexity without mass effect on the underlying brain, possibly a calcified meningioma and not felt to be of any clinical significance. Vascular: Calcified  atherosclerosis at the skullbase. No hyperdense vessel. Skull: No fracture. Sclerosis in the right greater sphenoid wing without aggressive features, possibly fibrous dysplasia. Sinuses/Orbits: Minimal scattered paranasal sinus mucosal thickening clear mastoid air cells. Bilateral cataract extraction. Other: None. IMPRESSION: 1. No evidence of acute intracranial hemorrhage. 2. Age indeterminate lacunar infarcts in the basal ganglia bilaterally. 3. Mild chronic small vessel ischemic disease. 4. Possible 8 mm left parietal meningioma. Electronically Signed   By: Sebastian Ache M.D.   On: 09/16/2016 19:50   US Abdomen Complete  Result Date: 09/17/2016 CLINICAL DATA:  83 year old female with abdominal pain, transaminitis and elevated bilirubin EXAM: ABDOMEN ULTRASOUND COMPLETE COMPARISON:  CT scan of the abdomen and pelvis 09/16/2016 FINDINGS: Gallbladder: The gallbladder is partially decompressed. Mildly striated and thickened gallbladder wall measuring 3-4 mm in diameter. No evidence of cholelithiasis. There is trace pericholecystic fluid. Per the sonographer, the sonographic Eulah Pont sign was negative. Common bile duct: Diameter: Within normal limits at 5-6 mm Liver: No focal lesion identified. Within normal limits in parenchymal echogenicity. Portal vein is patent on color Doppler imaging with normal direction of blood flow towards the liver. IVC: No abnormality visualized. Pancreas: Visualized portion unremarkable. Spleen: Size and appearance within normal limits. Right Kidney: Length: 10.0 cm. Mildly echogenic renal parenchyma with increased differentiation of the corticomedullary junction. Small anechoic cystic structure within imperceptible margin measuring 0.9 cm in the upper pole consistent with a simple cyst. Left Kidney: Length: 11.1 cm. Mildly echogenic renal parenchyma. Small anechoic cystic structure within imperceptible margin in the lower pole measures up to 1.2 cm consistent with a small simple cyst.  Abdominal aorta: No aneurysm visualized. Other findings: None. IMPRESSION: 1. Nonspecific findings of gallbladder wall thickening and stripe a shin as well as trace pericholecystic fluid without associated cholelithiasis or gallbladder distension. No sonographic Eulah Pont sign although this sign is less reliable in the elderly population. Differential considerations include acalculous cholecystitis, and gallbladder wall thickening related to intrinsic liver disease or hypoalbuminemia. 2. Normal diameter of the common bile duct. 3. Echogenic kidneys bilaterally suggests underlying medical renal disease. 4. Bilateral simple renal cysts. Electronically Signed   By: Malachy Moan M.D.   On: 09/17/2016 10:16   Ct Abdomen Pelvis W Contrast  Result Date: 09/17/2016 CLINICAL DATA:  Patient fell off of the head. Patient was seen this morning for a fall as well. Unspecified abdominal pain. EXAM: CT ABDOMEN AND PELVIS WITH CONTRAST TECHNIQUE: Multidetector CT imaging of the abdomen and pelvis was performed using the standard protocol following bolus administration of intravenous contrast. CONTRAST:  75mL ISOVUE-300 IOPAMIDOL (ISOVUE-300) INJECTION 61% COMPARISON:  CT right hip 09/16/2016 FINDINGS: Lower chest: Motion artifact limits evaluation. Probable atelectasis and scarring in the lung bases. Calcification of the aorta, mitral valve,  and coronary arteries. Hepatobiliary: No focal liver abnormality is seen. No gallstones, gallbladder wall thickening, or biliary dilatation. Pancreas: Unremarkable. No pancreatic ductal dilatation or surrounding inflammatory changes. Spleen: Normal in size without focal abnormality. Adrenals/Urinary Tract: No adrenal gland nodules. Subcentimeter cysts in the kidneys. No hydronephrosis. Nephrograms are symmetrical. Bladder is moderately distended without wall thickening. Stomach/Bowel: Stomach, small bowel, and colon are mostly decompressed. Scattered stool in the colon. Appendix is not  identified. Vascular/Lymphatic: Aortic atherosclerosis. No enlarged abdominal or pelvic lymph nodes. Prominent calcification at the origin of the celiac axis, renal artery origins, origin of the right iliac artery, and in the proximal/mid superior mesenteric artery. Vascular stenosis is likely. Vessels are patent. Reproductive: Uterus is not enlarged. Bilobed cystic structure demonstrated in the left adnexal region probably representing ovarian cyst. This measures about 2.1 x 3.9 cm in diameter. Ultrasound follow-up at 6-12 months is recommended. Other: No free air or free fluid in the abdomen. Abdominal wall musculature appears intact. Fatty atrophy of the gluteal muscles and paraspinal muscles. Infiltration in the subcutaneous fat over the left hip probably represents soft tissue contusion. This could represent a lipoma. Musculoskeletal: Degenerative changes throughout the lumbar spine. Mild anterior subluxation of L4 on L5 is likely degenerative. Prominent degenerative changes in the hips. No acute fractures are suggested. IMPRESSION: 1. No definite acute process demonstrated in the abdomen or pelvis. No evidence of bowel obstruction or inflammation. 2. Diffuse aortic atherosclerosis. Prominent calcification and multiple branch vessels may result in stenosis. Vessels appear patent. Coronary artery calcification. 3. Left adnexal cyst measuring 3.9 cm maximal diameter. Ultrasound follow-up at 6-12 months is recommended. 4. Degenerative changes in the lumbar spine and hips. Electronically Signed   By: Burman Nieves M.D.   On: 09/17/2016 00:00   Ct Hip Right Wo Contrast  Result Date: 09/16/2016 CLINICAL DATA:  Right hip pain after fall. EXAM: CT OF THE RIGHT HIP WITHOUT CONTRAST TECHNIQUE: Multidetector CT imaging of the right hip was performed according to the standard protocol. Multiplanar CT image reconstructions were also generated. COMPARISON:  Radiograph earlier this day. FINDINGS: Bones/Joint/Cartilage  No acute fracture. Advanced degenerative change of the right hip with complete joint space loss, flattening of femoral head and subchondral cystic change and scleroses. Large femoral head neck osteophytes creating the appearance step-off on radiograph. No evidence to suggest underlying avascular necrosis. The pubic rami are intact. Included right iliac bone and sacrum are intact. No hip joint effusion. Ligaments Suboptimally assessed by CT. Muscles and Tendons Evidence intramuscular hematoma. Soft tissues Faint soft tissue edema posteriorly without confluent hematoma. Advanced atherosclerosis. Large stool burden in the ascending colon is incidentally noted. IMPRESSION: Advanced degenerative change of the right hip without acute fracture. Electronically Signed   By: Rubye Oaks M.D.   On: 09/16/2016 22:30   Mr Abdomen Mrcp Wo Contrast  Result Date: 09/18/2016 CLINICAL DATA:  Patient with abnormal LFTs. Jaundice. Abdominal pain. EXAM: MRI ABDOMEN WITHOUT CONTRAST  (INCLUDING MRCP) TECHNIQUE: Multiplanar multisequence MR imaging of the abdomen was performed. Heavily T2-weighted images of the biliary and pancreatic ducts were obtained, and three-dimensional MRCP images were rendered by post processing. COMPARISON:  CT abdomen pelvis 09/16/2016; abdominal ultrasound 09/17/2016. FINDINGS: Markedly limited exam due to motion artifact and patient's inability to breath hold. Lower chest: Small bilateral pleural effusions. Probable dependent atelectasis bilateral lower lobes. Cardiomegaly. Hepatobiliary: The liver is normal in size and contour. There is wall thickening of the gallbladder measuring up to 4 mm. Small amount of pericholecystic fluid. No  definite filling defects identified within the gallbladder lumen. No definite intrahepatic or extrahepatic biliary ductal dilatation. Common bile duct measures 4 mm. MRCP images nondiagnostic due to motion artifact. Pancreas:  Unremarkable Spleen:  Unremarkable  Adrenals/Urinary Tract: Normal adrenal glands. Kidneys are symmetric in size. No hydronephrosis. 10 mm cyst interpolar region right kidney. 10 mm cyst interpolar region left kidney. Stomach/Bowel: Visualized large and small bowel is unremarkable. No evidence for bowel obstruction. Vascular/Lymphatic: Normal caliber abdominal aorta. No retroperitoneal lymphadenopathy. Other:  There is a 4.1 cm left adnexal cystic lesion. Musculoskeletal: Mild third spacing of fluid. No aggressive or acute appearing osseous lesions. IMPRESSION: Markedly limited exam secondary to motion artifact from patient's inability to breath hold. There is mild gallbladder wall thickening and pericholecystic fluid. The few visualized segments of the common bile duct are normal in diameter. No definite intraluminal filling defect identified although the majority of the common bile duct is not adequately assessed due to motion artifact. Findings are nonspecific however acalculous cholecystitis is a consideration. 4.1 cm left adnexal cystic lesion. In the nonacute setting, recommend further evaluation with pelvic ultrasound. These results were called by telephone at the time of interpretation on 09/18/2016 at 12:29 pm to Dr. Lionel December , who verbally acknowledged these results. Electronically Signed   By: Annia Belt M.D.   On: 09/18/2016 12:36   Mr 3d Recon At Scanner  Result Date: 09/18/2016 CLINICAL DATA:  Patient with abnormal LFTs. Jaundice. Abdominal pain. EXAM: MRI ABDOMEN WITHOUT CONTRAST  (INCLUDING MRCP) TECHNIQUE: Multiplanar multisequence MR imaging of the abdomen was performed. Heavily T2-weighted images of the biliary and pancreatic ducts were obtained, and three-dimensional MRCP images were rendered by post processing. COMPARISON:  CT abdomen pelvis 09/16/2016; abdominal ultrasound 09/17/2016. FINDINGS: Markedly limited exam due to motion artifact and patient's inability to breath hold. Lower chest: Small bilateral pleural  effusions. Probable dependent atelectasis bilateral lower lobes. Cardiomegaly. Hepatobiliary: The liver is normal in size and contour. There is wall thickening of the gallbladder measuring up to 4 mm. Small amount of pericholecystic fluid. No definite filling defects identified within the gallbladder lumen. No definite intrahepatic or extrahepatic biliary ductal dilatation. Common bile duct measures 4 mm. MRCP images nondiagnostic due to motion artifact. Pancreas:  Unremarkable Spleen:  Unremarkable Adrenals/Urinary Tract: Normal adrenal glands. Kidneys are symmetric in size. No hydronephrosis. 10 mm cyst interpolar region right kidney. 10 mm cyst interpolar region left kidney. Stomach/Bowel: Visualized large and small bowel is unremarkable. No evidence for bowel obstruction. Vascular/Lymphatic: Normal caliber abdominal aorta. No retroperitoneal lymphadenopathy. Other:  There is a 4.1 cm left adnexal cystic lesion. Musculoskeletal: Mild third spacing of fluid. No aggressive or acute appearing osseous lesions. IMPRESSION: Markedly limited exam secondary to motion artifact from patient's inability to breath hold. There is mild gallbladder wall thickening and pericholecystic fluid. The few visualized segments of the common bile duct are normal in diameter. No definite intraluminal filling defect identified although the majority of the common bile duct is not adequately assessed due to motion artifact. Findings are nonspecific however acalculous cholecystitis is a consideration. 4.1 cm left adnexal cystic lesion. In the nonacute setting, recommend further evaluation with pelvic ultrasound. These results were called by telephone at the time of interpretation on 09/18/2016 at 12:29 pm to Dr. Lionel December , who verbally acknowledged these results. Electronically Signed   By: Annia Belt M.D.   On: 09/18/2016 12:36   US Abdomen Limited Ruq  Result Date: 09/19/2016 CLINICAL DATA:  Jaundice, increasing bilirubin since  prior ultrasound, nondiagnostic MRCP EXAM: ULTRASOUND ABDOMEN LIMITED RIGHT UPPER QUADRANT COMPARISON:  09/17/2016 FINDINGS: Gallbladder: Persistent significant gallbladder wall thickening. Gallbladder less distended than on previous exam. Internal scattered echoes/debris. No definite shadowing calculi or sonographic Murphy sign. Common bile duct: Diameter: 5 mm diameter, previously 6 mm Liver: No focal lesion identified. Within normal limits in parenchymal echogenicity. Portal vein is patent on color Doppler imaging with normal direction of blood flow towards the liver. No RIGHT upper quadrant free fluid. Incidentally noted pancreatic duct 3 mm diameter upper normal. Incidentally noted tiny RIGHT renal cyst 11 mm diameter. IMPRESSION: Persistent significant gallbladder wall thickening without definite shadowing calculi or sonographic Murphy sign. Stable normal caliber CBD 5 mm diameter. Electronically Signed   By: Ulyses Southward M.D.   On: 09/19/2016 11:08    Craig Ionescu, DO  [facility] Pager [phone]  If 7PM-7AM, please contact night-coverage [email or web address] Password [email or web address] 09/21/2016, 5:04 PM   LOS: 2 days

## 2016-09-21 NOTE — Progress Notes (Signed)
  Subjective:  Patient denies nausea vomiting and abdominal pain. She also denies itching.  Objective: Blood pressure (!) 141/57, pulse 88, temperature 98.2 F (36.8 C), temperature source Oral, resp. rate 20, height 5\' 4"  (1.626 m), weight 112 lb (50.8 kg), SpO2 99 %.  Patient is  alert and in no acute distress.   Labs/studies Results:   Recent Labs  09/19/16 0433  WBC 8.4  HGB 10.5*  HCT 29.5*  PLT 276      Recent Labs  09/19/16 0433 09/20/16 0631 09/21/16 0833  PROT 4.3* 4.2* 4.7*  ALBUMIN 2.3* 2.2* 2.3*  AST 502* 545* 709*  ALT 827* 876* 1,190*  ALKPHOS 195* 235* 256*  BILITOT 11.8* 14.3* 18.1*  BILIDIR 8.1* 9.3* 12.3*  IBILI 3.7* 5.0* 5.8*    PT/INR   Recent Labs  09/21/16 0833  LABPROT 13.8  INR 1.07      Assessment:  #1. Cholestasis. Patient's bilirubin continued to rise rapidly with progressive increase in transaminases as well. She has both obstructive and hepatocellular disease pattern. Patient remains asymptomatic. No evidence of intra-or extrahepatic biliary dilation on CT and or ultrasound 2. MRCP did not help because of poor quality. Patient could not hold her breath.We been waiting for over two days for patient to be [facility]. There are no beds so far. I still feel patient has biliary tract disease rather than autoimmune process.   Recommendations:  Repeat lab in a.m along with antimitochondrial antibody. Repeat ultrasound in a.m. If there is no evidence of biliary dilation with proceed with transjugular liver biopsy.

## 2016-09-22 ENCOUNTER — Inpatient Hospital Stay (HOSPITAL_COMMUNITY): Payer: Medicare Other

## 2016-09-22 LAB — COMPREHENSIVE METABOLIC PANEL
ALBUMIN: 2 g/dL — AB (ref 3.5–5.0)
ALK PHOS: 216 U/L — AB (ref 38–126)
ALT: 1024 U/L — ABNORMAL HIGH (ref 14–54)
ANION GAP: 7 (ref 5–15)
AST: 479 U/L — ABNORMAL HIGH (ref 15–41)
BILIRUBIN TOTAL: 18.1 mg/dL — AB (ref 0.3–1.2)
BUN: 11 mg/dL (ref 6–20)
CALCIUM: 7.8 mg/dL — AB (ref 8.9–10.3)
CO2: 23 mmol/L (ref 22–32)
Chloride: 105 mmol/L (ref 101–111)
Creatinine, Ser: <0.3 mg/dL — ABNORMAL LOW (ref 0.44–1.00)
GLUCOSE: 104 mg/dL — AB (ref 65–99)
POTASSIUM: 3.2 mmol/L — AB (ref 3.5–5.1)
SODIUM: 135 mmol/L (ref 135–145)
TOTAL PROTEIN: 4.2 g/dL — AB (ref 6.5–8.1)

## 2016-09-22 LAB — CBC
HCT: 32.5 % — ABNORMAL LOW (ref 36.0–46.0)
Hemoglobin: 11.8 g/dL — ABNORMAL LOW (ref 12.0–15.0)
MCH: 33 pg (ref 26.0–34.0)
MCHC: 36.3 g/dL — ABNORMAL HIGH (ref 30.0–36.0)
MCV: 90.8 fL (ref 78.0–100.0)
Platelets: 378 10*3/uL (ref 150–400)
RBC: 3.58 MIL/uL — ABNORMAL LOW (ref 3.87–5.11)
RDW: 14.3 % (ref 11.5–15.5)
WBC: 8.7 10*3/uL (ref 4.0–10.5)

## 2016-09-22 LAB — PROTIME-INR
INR: 1.06
Prothrombin Time: 13.7 seconds (ref 11.4–15.2)

## 2016-09-22 NOTE — Progress Notes (Signed)
Pt being transferred with all belongings to [facility], Dr. Karilyn Cotaehman aware. [facility]. Pt and vitals stable. Will make pt's POA aware as well.

## 2016-09-22 NOTE — Progress Notes (Signed)
Attempted to make pt's group home aware of transfer, no answer and voicemail is full.

## 2016-09-23 LAB — MITOCHONDRIAL ANTIBODIES: Mitochondrial M2 Ab, IgG: 4.8 Units (ref 0.0–20.0)

## 2016-09-24 ENCOUNTER — Inpatient Hospital Stay (HOSPITAL_COMMUNITY)
Admission: AD | Admit: 2016-09-24 | Discharge: 2016-09-27 | DRG: 441 | Disposition: A | Discharge status: Skilled Nursing Facility | Payer: Medicare Other | Source: Other Acute Inpatient Hospital | Attending: Internal Medicine | Admitting: Internal Medicine

## 2016-09-24 DIAGNOSIS — I110 Hypertensive heart disease with heart failure: Secondary | ICD-10-CM | POA: Diagnosis present

## 2016-09-24 DIAGNOSIS — M810 Age-related osteoporosis without current pathological fracture: Secondary | ICD-10-CM | POA: Diagnosis present

## 2016-09-24 DIAGNOSIS — T370X5A Adverse effect of sulfonamides, initial encounter: Secondary | ICD-10-CM | POA: Diagnosis present

## 2016-09-24 DIAGNOSIS — I5021 Acute systolic (congestive) heart failure: Secondary | ICD-10-CM | POA: Diagnosis not present

## 2016-09-24 DIAGNOSIS — Z79899 Other long term (current) drug therapy: Secondary | ICD-10-CM

## 2016-09-24 DIAGNOSIS — E440 Moderate protein-calorie malnutrition: Secondary | ICD-10-CM | POA: Diagnosis present

## 2016-09-24 DIAGNOSIS — X58XXXA Exposure to other specified factors, initial encounter: Secondary | ICD-10-CM | POA: Diagnosis present

## 2016-09-24 DIAGNOSIS — Z681 Body mass index (BMI) 19 or less, adult: Secondary | ICD-10-CM | POA: Diagnosis not present

## 2016-09-24 DIAGNOSIS — K7589 Other specified inflammatory liver diseases: Secondary | ICD-10-CM | POA: Diagnosis present

## 2016-09-24 DIAGNOSIS — F70 Mild intellectual disabilities: Secondary | ICD-10-CM | POA: Diagnosis present

## 2016-09-24 DIAGNOSIS — S36118A Other injury of liver, initial encounter: Secondary | ICD-10-CM | POA: Diagnosis present

## 2016-09-24 DIAGNOSIS — R4189 Other symptoms and signs involving cognitive functions and awareness: Secondary | ICD-10-CM | POA: Diagnosis present

## 2016-09-24 DIAGNOSIS — F0390 Unspecified dementia without behavioral disturbance: Secondary | ICD-10-CM | POA: Diagnosis present

## 2016-09-24 DIAGNOSIS — R531 Weakness: Secondary | ICD-10-CM | POA: Diagnosis present

## 2016-09-24 DIAGNOSIS — Z66 Do not resuscitate: Secondary | ICD-10-CM | POA: Diagnosis present

## 2016-09-24 DIAGNOSIS — I10 Essential (primary) hypertension: Secondary | ICD-10-CM | POA: Diagnosis present

## 2016-09-24 DIAGNOSIS — R748 Abnormal levels of other serum enzymes: Secondary | ICD-10-CM

## 2016-09-24 DIAGNOSIS — K831 Obstruction of bile duct: Secondary | ICD-10-CM | POA: Diagnosis present

## 2016-09-24 DIAGNOSIS — E46 Unspecified protein-calorie malnutrition: Secondary | ICD-10-CM | POA: Diagnosis present

## 2016-09-24 DIAGNOSIS — I159 Secondary hypertension, unspecified: Secondary | ICD-10-CM | POA: Diagnosis not present

## 2016-09-24 DIAGNOSIS — Z886 Allergy status to analgesic agent status: Secondary | ICD-10-CM

## 2016-09-24 DIAGNOSIS — F819 Developmental disorder of scholastic skills, unspecified: Secondary | ICD-10-CM | POA: Diagnosis present

## 2016-09-24 DIAGNOSIS — K716 Toxic liver disease with hepatitis, not elsewhere classified: Principal | ICD-10-CM | POA: Diagnosis present

## 2016-09-24 DIAGNOSIS — I509 Heart failure, unspecified: Secondary | ICD-10-CM | POA: Diagnosis present

## 2016-09-24 DIAGNOSIS — R740 Nonspecific elevation of levels of transaminase and lactic acid dehydrogenase [LDH]: Secondary | ICD-10-CM | POA: Diagnosis present

## 2016-09-24 LAB — CBC
HCT: 34 % — ABNORMAL LOW (ref 36.0–46.0)
Hemoglobin: 11.8 g/dL — ABNORMAL LOW (ref 12.0–15.0)
MCH: 32.7 pg (ref 26.0–34.0)
MCHC: 34.7 g/dL (ref 30.0–36.0)
MCV: 94.2 fL (ref 78.0–100.0)
PLATELETS: 458 10*3/uL — AB (ref 150–400)
RBC: 3.61 MIL/uL — AB (ref 3.87–5.11)
RDW: 16.1 % — AB (ref 11.5–15.5)
WBC: 6.6 10*3/uL (ref 4.0–10.5)

## 2016-09-24 LAB — MAGNESIUM: Magnesium: 1.8 mg/dL (ref 1.7–2.4)

## 2016-09-24 LAB — LIPASE, BLOOD: Lipase: 136 U/L — ABNORMAL HIGH (ref 11–51)

## 2016-09-24 LAB — COMPREHENSIVE METABOLIC PANEL
ALT: 581 U/L — AB (ref 14–54)
ANION GAP: 8 (ref 5–15)
AST: 185 U/L — ABNORMAL HIGH (ref 15–41)
Albumin: 2.3 g/dL — ABNORMAL LOW (ref 3.5–5.0)
Alkaline Phosphatase: 291 U/L — ABNORMAL HIGH (ref 38–126)
BUN: 13 mg/dL (ref 6–20)
CALCIUM: 8.3 mg/dL — AB (ref 8.9–10.3)
CHLORIDE: 102 mmol/L (ref 101–111)
CO2: 24 mmol/L (ref 22–32)
CREATININE: 0.4 mg/dL — AB (ref 0.44–1.00)
Glucose, Bld: 136 mg/dL — ABNORMAL HIGH (ref 65–99)
POTASSIUM: 3.9 mmol/L (ref 3.5–5.1)
SODIUM: 134 mmol/L — AB (ref 135–145)
Total Bilirubin: 16.8 mg/dL — ABNORMAL HIGH (ref 0.3–1.2)
Total Protein: 5.3 g/dL — ABNORMAL LOW (ref 6.5–8.1)

## 2016-09-24 LAB — PHOSPHORUS: PHOSPHORUS: 2.4 mg/dL — AB (ref 2.5–4.6)

## 2016-09-24 LAB — LACTIC ACID, PLASMA: Lactic Acid, Venous: 1.4 mmol/L (ref 0.5–1.9)

## 2016-09-24 MED ORDER — ONDANSETRON HCL 4 MG/2ML IJ SOLN: 4.0000 mg | Freq: Four times a day (QID) | INTRAMUSCULAR | Status: DC | PRN

## 2016-09-24 MED ORDER — ENOXAPARIN SODIUM 40 MG/0.4ML ~~LOC~~ SOLN
40.0000 mg | SUBCUTANEOUS | Status: DC
  Administered 2016-09-24 – 2016-09-26 (×3): 40 mg via SUBCUTANEOUS
  Filled 2016-09-24 (×3): qty 0.4

## 2016-09-24 MED ORDER — ONDANSETRON HCL 4 MG PO TABS: 4.0000 mg | ORAL_TABLET | Freq: Four times a day (QID) | ORAL | Status: DC | PRN

## 2016-09-24 MED ORDER — ACETAMINOPHEN 325 MG PO TABS: 650.0000 mg | ORAL_TABLET | Freq: Four times a day (QID) | ORAL | Status: DC | PRN

## 2016-09-24 MED ORDER — ACETAMINOPHEN 650 MG RE SUPP: 650.0000 mg | Freq: Four times a day (QID) | RECTAL | Status: DC | PRN

## 2016-09-24 MED ORDER — METOPROLOL TARTRATE 25 MG PO TABS
25.0000 mg | ORAL_TABLET | Freq: Two times a day (BID) | ORAL | Status: DC
  Administered 2016-09-24 – 2016-09-27 (×6): 25 mg via ORAL
  Filled 2016-09-24 (×6): qty 1

## 2016-09-24 MED ORDER — HYDRALAZINE HCL 20 MG/ML IJ SOLN: 10.0000 mg | Freq: Three times a day (TID) | INTRAMUSCULAR | Status: DC | PRN

## 2016-09-24 NOTE — H&P (Signed)
[facility] History and Physical  Lisa [name] RUE:[id number] DOB: 08-15-1923 DOA: 09/24/2016  Referring physician:  PCP: System, Pcp Not In   Chief Complaint:  Weakness & elevated liver enzymes  HPI: Lisa [name] is a 83 y.o. female  past medical history significant for heart failure, hypertension, mild mental retardation, osteoporosis and arthritis who presents as a transfer from [facility] for elevated liver enzymes. Patient originally presented to [facility] on 9/8 for weakness and fatigue. Was found to have severe elevation of liver enzymes and was transferred to [facility] for possible ERCP. After brief stay at [facility] and seeing gastroenterology there felt they had nothing to offer the patient had requested transfer back [facility]. Patient's bilirubin had been as high as 18 but had trended down during the stay per review of records. Thinking is that elevated bilirubin is from liver injury due to Bactrim patient was prescribed for a skin infection.   Review of Systems:  As per HPI otherwise 10 point review of systems negative.    Past Medical History:  Diagnosis Date  . CHF (congestive heart failure) (HCC)   . DJD (degenerative joint disease)   . Hypertension   . Mental retardation    mild  . Osteoporosis    No past surgical history on file. Social History:  reports that she has never smoked. She has never used smokeless tobacco. She reports that she does not drink alcohol or use drugs.  Allergies  Allergen Reactions  . Advil [Ibuprofen]   . Aspirin   . Naprosyn [Naproxen]     No family history on file.   Prior to Admission medications   Medication Sig Start Date End Date Taking? Authorizing Provider  amLODipine-benazepril (LOTREL) 5-20 MG capsule  08/22/16   [provider]  metoprolol tartrate (LOPRESSOR) 25 MG tablet  08/22/16   [provider]  pantoprazole (PROTONIX) 40 MG tablet  08/22/16   [provider]   Physical Exam: Vitals:   09/24/16 1859  BP: (!) 172/73  Pulse: 82  Resp: 18  Temp: 98.4 F (36.9 C)  TempSrc: Oral  SpO2: 98%  Weight: 53.7 kg (118 lb 6.4 oz)  Height:  (1.626 m)    Wt Readings from Last 3 Encounters:  09/24/16 53.7 kg (118 lb 6.4 oz)  09/22/16 53.9 kg (118 lb 14.4 oz)  06/16/15 48.3 kg (106 lb 6.4 oz)    General:  Appears calm and comfortable; A&Ox1 (baseline) Eyes:  PERRL, EOMI, normal lids, iris ENT:  grossly normal hearing, lips & tongue Neck:  no LAD, masses or thyromegaly Cardiovascular:  RRR, no m/r/g. No LE edema.  Respiratory:  CTA bilaterally, no w/r/r. Normal respiratory effort. Abdomen:  soft, ntnd Skin:  no rash or induration seen on limited exam; jaundice Musculoskeletal:  grossly normal tone BUE/BLE Psychiatric:  grossly normal mood and affect, speech fluent and appropriate Neurologic:  CN 2-12 grossly intact, moves all extremities in coordinated fashion.          Labs on Admission:  Basic Metabolic Panel:  Recent Labs Lab 09/22/16 0430  NA 135  K 3.2*  CL 105  CO2 23  GLUCOSE 104*  BUN 11  CREATININE <0.30*  CALCIUM 7.8*   Liver Function Tests:  Recent Labs Lab 09/18/16 0555 09/19/16 0433 09/20/16 0631 09/21/16 0833 09/22/16 0430  AST 448* 502* 545* 709* 479*  ALT 773* 827* 876* 1,190* 1,024*  ALKPHOS 167* 195* 235* 256* 216*  BILITOT 8.2* 11.8* 14.3* 18.1* 18.1*  PROT  4.2* 4.3* 4.2* 4.7* 4.2*  ALBUMIN 2.4* 2.3* 2.2* 2.3* 2.0*   No results for input(s): LIPASE, AMYLASE in the last 168 hours. No results for input(s): AMMONIA in the last 168 hours. CBC:  Recent Labs Lab 09/19/16 0433 09/22/16 0430  WBC 8.4 8.7  HGB 10.5* 11.8*  HCT 29.5* 32.5*  MCV 91.0 90.8  PLT 276 378   Cardiac Enzymes: No results for input(s): CKTOTAL, CKMB, CKMBINDEX, TROPONINI in the last 168 hours.  BNP (last 3 results) No results for input(s): BNP in the last 8760 hours.  ProBNP (last 3 results) No results for input(s): PROBNP in the last 8760  hours.   Creatinine clearance cannot be calculated (This lab value cannot be used to calculate CrCl because it is not a number: <0.30)  CBG: No results for input(s): GLUCAP in the last 168 hours.  Radiological Exams on Admission: No results found.  EKG: Independently reviewed 9/10 EKG, no stemi.  Assessment/Plan Active Problems:   Elevated liver enzymes   Lisa [name] is a 83yo F w/ hx of cognitive impairment (lives in group home), CHF, and HTN who presents with hyperbilirubinemia and transaminitis c/w drug induced liver injury.  Jaundice w/ transaminitis and elevated ALP c/w DILI:  Resolving w/ discontinuation of bactrim. T.Bili of 18 w/ predominant direct bilirubin. Now down to 16 before transfer Was for obstructive jaudince but abdominal CT, RUQ [place] and MRCP w/ no evidence of biliary duct dilatation or biliary stones NTD except avoid bactrim - no need for ERCP per [facility] team - trend CMP daily  - Previously negative: Mitochondrial antibody, HCV CV RNA Quant, anti-smooth muscle antibody, ANA, sed rate  Abn electrolytes Will recheck NA, K, Phos as they were abn at OSH Replete as needed  Mild elevated lipase Will Recheck  Hx of Heart failure: Reported in patient's chart, but no echo to confirm. Pt does have LE edema that appears chronic in nature - Metoprolol tartrate 25 mg daily - hold home benazepril and amlodipine - hold lasix?  Hypertension When necessary hydralazine 10 mg IV as needed for severe blood pressure Cont lopressor only  Hx of Learning Disability: Reported from chart. On exam, patient is AOx1 and not able to make decisions for herself. She lives in a group home.  - Work with guardian for all decision-making  FEN/PPx: - PO intake - replete prn - regular diet - lovenox  Code Status: FULL  Family Communication: none at bedside, has a guardian Disposition Plan: Pending Improvement  Status: inpt medsurg  Haydee Salter, MD Family  Medicine [facility] [email or web address] Password [email or web address]

## 2016-09-25 DIAGNOSIS — R531 Weakness: Secondary | ICD-10-CM

## 2016-09-25 DIAGNOSIS — I5021 Acute systolic (congestive) heart failure: Secondary | ICD-10-CM

## 2016-09-25 LAB — CBC
HEMATOCRIT: 32.2 % — AB (ref 36.0–46.0)
Hemoglobin: 11 g/dL — ABNORMAL LOW (ref 12.0–15.0)
MCH: 32.1 pg (ref 26.0–34.0)
MCHC: 34.2 g/dL (ref 30.0–36.0)
MCV: 93.9 fL (ref 78.0–100.0)
Platelets: 424 10*3/uL — ABNORMAL HIGH (ref 150–400)
RBC: 3.43 MIL/uL — ABNORMAL LOW (ref 3.87–5.11)
RDW: 16.6 % — AB (ref 11.5–15.5)
WBC: 5.5 10*3/uL (ref 4.0–10.5)

## 2016-09-25 LAB — BASIC METABOLIC PANEL
Anion gap: 6 (ref 5–15)
BUN: 12 mg/dL (ref 6–20)
CHLORIDE: 103 mmol/L (ref 101–111)
CO2: 24 mmol/L (ref 22–32)
CREATININE: 0.42 mg/dL — AB (ref 0.44–1.00)
Calcium: 7.9 mg/dL — ABNORMAL LOW (ref 8.9–10.3)
GFR calc Af Amer: >60 mL/min (ref >60)
GFR calc non Af Amer: >60 mL/min (ref >60)
GLUCOSE: 97 mg/dL (ref 65–99)
POTASSIUM: 4 mmol/L (ref 3.5–5.1)
Sodium: 133 mmol/L — ABNORMAL LOW (ref 135–145)

## 2016-09-25 LAB — HEPATIC FUNCTION PANEL
ALK PHOS: 239 U/L — AB (ref 38–126)
ALT: 474 U/L — AB (ref 14–54)
AST: 148 U/L — AB (ref 15–41)
Albumin: 2 g/dL — ABNORMAL LOW (ref 3.5–5.0)
BILIRUBIN DIRECT: 7.1 mg/dL — AB (ref 0.1–0.5)
Indirect Bilirubin: 6.4 mg/dL — ABNORMAL HIGH (ref 0.3–0.9)
Total Bilirubin: 13.5 mg/dL — ABNORMAL HIGH (ref 0.3–1.2)
Total Protein: 4.5 g/dL — ABNORMAL LOW (ref 6.5–8.1)

## 2016-09-25 LAB — PROTIME-INR
INR: 1.03
Prothrombin Time: 13.4 seconds (ref 11.4–15.2)

## 2016-09-25 LAB — LACTIC ACID, PLASMA: Lactic Acid, Venous: 1.1 mmol/L (ref 0.5–1.9)

## 2016-09-25 NOTE — Progress Notes (Addendum)
[facility] PROGRESS NOTE  Alcario Drought ZOX:[id number] DOB: 12/10/1923 DOA: 09/24/2016   PCP: System, Pcp Not In     Assessment/Plan: Principal Problem:   Elevated liver enzymes Active Problems:   Weakness   CHF (congestive heart failure) (HCC)   HTN (hypertension)     Ms. Lisa [name] is a 83yo F w/ hx of cognitive impairment (lives in group home), CHF, and HTN who was admitted to   [facility] with hyperbilirubinemia and transaminitis c/f drug induced liver injury vs obstructive jaundice. Hepatology favored drug-induced liver enzyme elevation based on imaging and time course with recent Bactrim. She remains stable and requires ongoing monitoring.    Assessment and plan  Jaundice w/ transaminitis and elevated ALP likely due to DILI LFTs showed elevated transaminases w/ ALT > 1000 and AST ~ 700 on admission. Presence of elevated ALP and T.Bili w/ predominant direct bilirubin points toward cholestasis. However both abdominal CT, RUQ [place] and MRCP at OSH did not show any signs of biliary duct dilatation or biliary stones. Taking together, this findings indicate that intrahepatic cholestasis is present. The patient was on TMP-SMZ recently. 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 It is also possible that she had a stone and was able to pass it spontaneously. She has been hypothermic in the previous days, but has not spiked any fevers. Her temp is normal on admission. Would avoid bactrim in the future and list as allergy. Hepatology recs to continue to monitor labs for a couple more days to ensure down trend in AST/ALS/Bili.    Congestive heart failure, compensated  Unsure of details re: systolic function. Patient was on lasix, metoprolol and benazepril at home. Will start with metoprolol 25 mg only. No lasix required since patient was not volume overloaded on exam and on room air currently. Low sodium diet.   HTN-currently controlled She is on  amlodipine-benazepril and metoprolol at home. BP has been in the 120s/50s on admission, so we will hold home BP meds,continue  BB as above. Consider if/when to restart the other meds based on BP  and with PCP follow up.    DVT prophylaxsis Lovenox  Code Status:  DNR   Family Communication: Discussed in detail with the patient, all imaging results, lab results explained to the patient   Disposition Plan: 1-2 days, PT eval      Consultants:  GI  Procedures:  None  Antibiotics: Anti-infectives    None         HPI/Subjective: Confused, comfortable, jaundiced  Objective: Vitals:   09/24/16 1859 09/24/16 2122 09/25/16 0433  BP: (!) 172/73 (!) 155/65 (!) 157/60  Pulse: 82 78 79  Resp: 18 16 18   Temp: 98.4 F (36.9 C) 97.8 F (36.6 C) 97.7 F (36.5 C)  TempSrc: Oral Oral Oral  SpO2: 98% 99% 97%  Weight: 53.7 kg (118 lb 6.4 oz)  53.7 kg (118 lb 4.8 oz)  Height: 5\' 4"  (1.626 m)      Intake/Output Summary (Last 24 hours) at 09/25/16 0947 Last data filed at 09/25/16 0942  Gross per 24 hour  Intake              720 ml  Output             1050 ml  Net             -330 ml    Exam:  Examination:  General exam: Appears calm and comfortable  Respiratory  system: Clear to auscultation. Respiratory effort normal. Cardiovascular system: S1 & S2 heard, RRR. No JVD, murmurs, rubs, gallops or clicks. No pedal edema. Gastrointestinal system: Abdomen is nondistended, soft and nontender. No organomegaly or masses felt. Normal bowel sounds heard. Central nervous system: Alert and oriented. No focal neurological deficits. Extremities: Symmetric 5 x 5 power. Skin: No rashes, lesions or ulcers Psychiatry: Confused     Data Reviewed: I have personally reviewed following labs and imaging studies  Micro Results Recent Results (from the past 240 hour(s))  MRSA PCR Screening     Status: None   Collection Time: 09/17/16  2:20 AM  Result Value Ref Range Status   MRSA by PCR  NEGATIVE NEGATIVE Final    Comment:        The GeneXpert MRSA Assay (FDA approved for NASAL specimens only), is one component of a comprehensive MRSA colonization surveillance program. It is not intended to diagnose MRSA infection nor to guide or monitor treatment for MRSA infections.     Radiology Reports Dg Pelvis 1-2 Views  Result Date: 09/16/2016 CLINICAL DATA:  Fall EXAM: PELVIS - 1-2 VIEW COMPARISON:  None. FINDINGS: Osteopenia. SI joint arthritis. Pubic symphysis and rami appear intact. Vascular calcifications. Advanced arthritis at the right hip with chronic deformity of the right femoral head. Possible step-off deformity at the right femoral head neck junction. Moderate degenerative changes of the left hip IMPRESSION: 1. Advanced arthritis of the right hip with chronic deformity of the right femoral head. Questionable step-off deformity at the right femoral head neck junction, suggest CT for further evaluation. Electronically Signed   By: Jasmine Pang M.D.   On: 09/16/2016 19:39   Dg Tibia/fibula Right  Result Date: 09/16/2016 CLINICAL DATA:  Initial evaluation for acute trauma, fall. EXAM: RIGHT TIBIA AND FIBULA - 2 VIEW COMPARISON:  None. FINDINGS: No acute fracture or dislocation. Bones are diffusely osteopenic. Degenerative changes noted about the knee and ankle. No acute soft tissue abnormality. Prominent vascular calcifications. IMPRESSION: No acute osseous abnormality about the right tibia/fibula. Electronically Signed   By: Rise Mu M.D.   On: 09/16/2016 19:55   Ct Head Wo Contrast  Result Date: 09/16/2016 CLINICAL DATA:  Head trauma. Larey Seat out of bed last night. Lightheadedness and dizziness. Initial encounter. EXAM: CT HEAD WITHOUT CONTRAST TECHNIQUE: Contiguous axial images were obtained from the base of the skull through the vertex without intravenous contrast. COMPARISON:  None. FINDINGS: Brain: A 4 mm hyperdense focus in the right corona radiata has  attenuation consistent with calcification. There is no evidence of acute cortical infarct, intracranial hemorrhage, midline shift, or extra-axial fluid collection. Mild generalized cerebral atrophy is within normal limits for age. Periventricular white matter hypoattenuation is nonspecific but compatible with mild chronic small vessel ischemic disease. Lacunar infarcts are present in the basal ganglia bilaterally. There is an 8 mm focus of calcification over the left parietal convexity without mass effect on the underlying brain, possibly a calcified meningioma and not felt to be of any clinical significance. Vascular: Calcified atherosclerosis at the skullbase. No hyperdense vessel. Skull: No fracture. Sclerosis in the right greater sphenoid wing without aggressive features, possibly fibrous dysplasia. Sinuses/Orbits: Minimal scattered paranasal sinus mucosal thickening clear mastoid air cells. Bilateral cataract extraction. Other: None. IMPRESSION: 1. No evidence of acute intracranial hemorrhage. 2. Age indeterminate lacunar infarcts in the basal ganglia bilaterally. 3. Mild chronic small vessel ischemic disease. 4. Possible 8 mm left parietal meningioma. Electronically Signed   By: Jolaine Click.D.  On: 09/16/2016 19:50   US Abdomen Complete  Result Date: 09/17/2016 CLINICAL DATA:  83 year old female with abdominal pain, transaminitis and elevated bilirubin EXAM: ABDOMEN ULTRASOUND COMPLETE COMPARISON:  CT scan of the abdomen and pelvis 09/16/2016 FINDINGS: Gallbladder: The gallbladder is partially decompressed. Mildly striated and thickened gallbladder wall measuring 3-4 mm in diameter. No evidence of cholelithiasis. There is trace pericholecystic fluid. Per the sonographer, the sonographic Eulah Pont sign was negative. Common bile duct: Diameter: Within normal limits at 5-6 mm Liver: No focal lesion identified. Within normal limits in parenchymal echogenicity. Portal vein is patent on color Doppler imaging  with normal direction of blood flow towards the liver. IVC: No abnormality visualized. Pancreas: Visualized portion unremarkable. Spleen: Size and appearance within normal limits. Right Kidney: Length: 10.0 cm. Mildly echogenic renal parenchyma with increased differentiation of the corticomedullary junction. Small anechoic cystic structure within imperceptible margin measuring 0.9 cm in the upper pole consistent with a simple cyst. Left Kidney: Length: 11.1 cm. Mildly echogenic renal parenchyma. Small anechoic cystic structure within imperceptible margin in the lower pole measures up to 1.2 cm consistent with a small simple cyst. Abdominal aorta: No aneurysm visualized. Other findings: None. IMPRESSION: 1. Nonspecific findings of gallbladder wall thickening and stripe a shin as well as trace pericholecystic fluid without associated cholelithiasis or gallbladder distension. No sonographic Eulah Pont sign although this sign is less reliable in the elderly population. Differential considerations include acalculous cholecystitis, and gallbladder wall thickening related to intrinsic liver disease or hypoalbuminemia. 2. Normal diameter of the common bile duct. 3. Echogenic kidneys bilaterally suggests underlying medical renal disease. 4. Bilateral simple renal cysts. Electronically Signed   By: Malachy Moan M.D.   On: 09/17/2016 10:16   Ct Abdomen Pelvis W Contrast  Result Date: 09/17/2016 CLINICAL DATA:  Patient fell off of the head. Patient was seen this morning for a fall as well. Unspecified abdominal pain. EXAM: CT ABDOMEN AND PELVIS WITH CONTRAST TECHNIQUE: Multidetector CT imaging of the abdomen and pelvis was performed using the standard protocol following bolus administration of intravenous contrast. CONTRAST:  75mL ISOVUE-300 IOPAMIDOL (ISOVUE-300) INJECTION 61% COMPARISON:  CT right hip 09/16/2016 FINDINGS: Lower chest: Motion artifact limits evaluation. Probable atelectasis and scarring in the lung bases.  Calcification of the aorta, mitral valve, and coronary arteries. Hepatobiliary: No focal liver abnormality is seen. No gallstones, gallbladder wall thickening, or biliary dilatation. Pancreas: Unremarkable. No pancreatic ductal dilatation or surrounding inflammatory changes. Spleen: Normal in size without focal abnormality. Adrenals/Urinary Tract: No adrenal gland nodules. Subcentimeter cysts in the kidneys. No hydronephrosis. Nephrograms are symmetrical. Bladder is moderately distended without wall thickening. Stomach/Bowel: Stomach, small bowel, and colon are mostly decompressed. Scattered stool in the colon. Appendix is not identified. Vascular/Lymphatic: Aortic atherosclerosis. No enlarged abdominal or pelvic lymph nodes. Prominent calcification at the origin of the celiac axis, renal artery origins, origin of the right iliac artery, and in the proximal/mid superior mesenteric artery. Vascular stenosis is likely. Vessels are patent. Reproductive: Uterus is not enlarged. Bilobed cystic structure demonstrated in the left adnexal region probably representing ovarian cyst. This measures about 2.1 x 3.9 cm in diameter. Ultrasound follow-up at 6-12 months is recommended. Other: No free air or free fluid in the abdomen. Abdominal wall musculature appears intact. Fatty atrophy of the gluteal muscles and paraspinal muscles. Infiltration in the subcutaneous fat over the left hip probably represents soft tissue contusion. This could represent a lipoma. Musculoskeletal: Degenerative changes throughout the lumbar spine. Mild anterior subluxation of L4 on L5 is likely  degenerative. Prominent degenerative changes in the hips. No acute fractures are suggested. IMPRESSION: 1. No definite acute process demonstrated in the abdomen or pelvis. No evidence of bowel obstruction or inflammation. 2. Diffuse aortic atherosclerosis. Prominent calcification and multiple branch vessels may result in stenosis. Vessels appear patent. Coronary  artery calcification. 3. Left adnexal cyst measuring 3.9 cm maximal diameter. Ultrasound follow-up at 6-12 months is recommended. 4. Degenerative changes in the lumbar spine and hips. Electronically Signed   By: Burman Nieves M.D.   On: 09/17/2016 00:00   Ct Hip Right Wo Contrast  Result Date: 09/16/2016 CLINICAL DATA:  Right hip pain after fall. EXAM: CT OF THE RIGHT HIP WITHOUT CONTRAST TECHNIQUE: Multidetector CT imaging of the right hip was performed according to the standard protocol. Multiplanar CT image reconstructions were also generated. COMPARISON:  Radiograph earlier this day. FINDINGS: Bones/Joint/Cartilage No acute fracture. Advanced degenerative change of the right hip with complete joint space loss, flattening of femoral head and subchondral cystic change and scleroses. Large femoral head neck osteophytes creating the appearance step-off on radiograph. No evidence to suggest underlying avascular necrosis. The pubic rami are intact. Included right iliac bone and sacrum are intact. No hip joint effusion. Ligaments Suboptimally assessed by CT. Muscles and Tendons Evidence intramuscular hematoma. Soft tissues Faint soft tissue edema posteriorly without confluent hematoma. Advanced atherosclerosis. Large stool burden in the ascending colon is incidentally noted. IMPRESSION: Advanced degenerative change of the right hip without acute fracture. Electronically Signed   By: Rubye Oaks M.D.   On: 09/16/2016 22:30   Mr Abdomen Mrcp Wo Contrast  Result Date: 09/18/2016 CLINICAL DATA:  Patient with abnormal LFTs. Jaundice. Abdominal pain. EXAM: MRI ABDOMEN WITHOUT CONTRAST  (INCLUDING MRCP) TECHNIQUE: Multiplanar multisequence MR imaging of the abdomen was performed. Heavily T2-weighted images of the biliary and pancreatic ducts were obtained, and three-dimensional MRCP images were rendered by post processing. COMPARISON:  CT abdomen pelvis 09/16/2016; abdominal ultrasound 09/17/2016. FINDINGS:  Markedly limited exam due to motion artifact and patient's inability to breath hold. Lower chest: Small bilateral pleural effusions. Probable dependent atelectasis bilateral lower lobes. Cardiomegaly. Hepatobiliary: The liver is normal in size and contour. There is wall thickening of the gallbladder measuring up to 4 mm. Small amount of pericholecystic fluid. No definite filling defects identified within the gallbladder lumen. No definite intrahepatic or extrahepatic biliary ductal dilatation. Common bile duct measures 4 mm. MRCP images nondiagnostic due to motion artifact. Pancreas:  Unremarkable Spleen:  Unremarkable Adrenals/Urinary Tract: Normal adrenal glands. Kidneys are symmetric in size. No hydronephrosis. 10 mm cyst interpolar region right kidney. 10 mm cyst interpolar region left kidney. Stomach/Bowel: Visualized large and small bowel is unremarkable. No evidence for bowel obstruction. Vascular/Lymphatic: Normal caliber abdominal aorta. No retroperitoneal lymphadenopathy. Other:  There is a 4.1 cm left adnexal cystic lesion. Musculoskeletal: Mild third spacing of fluid. No aggressive or acute appearing osseous lesions. IMPRESSION: Markedly limited exam secondary to motion artifact from patient's inability to breath hold. There is mild gallbladder wall thickening and pericholecystic fluid. The few visualized segments of the common bile duct are normal in diameter. No definite intraluminal filling defect identified although the majority of the common bile duct is not adequately assessed due to motion artifact. Findings are nonspecific however acalculous cholecystitis is a consideration. 4.1 cm left adnexal cystic lesion. In the nonacute setting, recommend further evaluation with pelvic ultrasound. These results were called by telephone at the time of interpretation on 09/18/2016 at 12:29 pm to Dr. Lionel December , who  verbally acknowledged these results. Electronically Signed   By: Annia Belt M.D.   On:  09/18/2016 12:36   Mr 3d Recon At Scanner  Result Date: 09/18/2016 CLINICAL DATA:  Patient with abnormal LFTs. Jaundice. Abdominal pain. EXAM: MRI ABDOMEN WITHOUT CONTRAST  (INCLUDING MRCP) TECHNIQUE: Multiplanar multisequence MR imaging of the abdomen was performed. Heavily T2-weighted images of the biliary and pancreatic ducts were obtained, and three-dimensional MRCP images were rendered by post processing. COMPARISON:  CT abdomen pelvis 09/16/2016; abdominal ultrasound 09/17/2016. FINDINGS: Markedly limited exam due to motion artifact and patient's inability to breath hold. Lower chest: Small bilateral pleural effusions. Probable dependent atelectasis bilateral lower lobes. Cardiomegaly. Hepatobiliary: The liver is normal in size and contour. There is wall thickening of the gallbladder measuring up to 4 mm. Small amount of pericholecystic fluid. No definite filling defects identified within the gallbladder lumen. No definite intrahepatic or extrahepatic biliary ductal dilatation. Common bile duct measures 4 mm. MRCP images nondiagnostic due to motion artifact. Pancreas:  Unremarkable Spleen:  Unremarkable Adrenals/Urinary Tract: Normal adrenal glands. Kidneys are symmetric in size. No hydronephrosis. 10 mm cyst interpolar region right kidney. 10 mm cyst interpolar region left kidney. Stomach/Bowel: Visualized large and small bowel is unremarkable. No evidence for bowel obstruction. Vascular/Lymphatic: Normal caliber abdominal aorta. No retroperitoneal lymphadenopathy. Other:  There is a 4.1 cm left adnexal cystic lesion. Musculoskeletal: Mild third spacing of fluid. No aggressive or acute appearing osseous lesions. IMPRESSION: Markedly limited exam secondary to motion artifact from patient's inability to breath hold. There is mild gallbladder wall thickening and pericholecystic fluid. The few visualized segments of the common bile duct are normal in diameter. No definite intraluminal filling defect identified  although the majority of the common bile duct is not adequately assessed due to motion artifact. Findings are nonspecific however acalculous cholecystitis is a consideration. 4.1 cm left adnexal cystic lesion. In the nonacute setting, recommend further evaluation with pelvic ultrasound. These results were called by telephone at the time of interpretation on 09/18/2016 at 12:29 pm to Dr. Lionel December , who verbally acknowledged these results. Electronically Signed   By: Annia Belt M.D.   On: 09/18/2016 12:36   US Abdomen Limited Ruq  Result Date: 09/19/2016 CLINICAL DATA:  Jaundice, increasing bilirubin since prior ultrasound, nondiagnostic MRCP EXAM: ULTRASOUND ABDOMEN LIMITED RIGHT UPPER QUADRANT COMPARISON:  09/17/2016 FINDINGS: Gallbladder: Persistent significant gallbladder wall thickening. Gallbladder less distended than on previous exam. Internal scattered echoes/debris. No definite shadowing calculi or sonographic Murphy sign. Common bile duct: Diameter: 5 mm diameter, previously 6 mm Liver: No focal lesion identified. Within normal limits in parenchymal echogenicity. Portal vein is patent on color Doppler imaging with normal direction of blood flow towards the liver. No RIGHT upper quadrant free fluid. Incidentally noted pancreatic duct 3 mm diameter upper normal. Incidentally noted tiny RIGHT renal cyst 11 mm diameter. IMPRESSION: Persistent significant gallbladder wall thickening without definite shadowing calculi or sonographic Murphy sign. Stable normal caliber CBD 5 mm diameter. Electronically Signed   By: Ulyses Southward M.D.   On: 09/19/2016 11:08     CBC  Recent Labs Lab 09/19/16 0433 09/22/16 0430 09/24/16 2039 09/25/16 0542  WBC 8.4 8.7 6.6 5.5  HGB 10.5* 11.8* 11.8* 11.0*  HCT 29.5* 32.5* 34.0* 32.2*  PLT 276 378 458* 424*  MCV 91.0 90.8 94.2 93.9  MCH 32.4 33.0 32.7 32.1  MCHC 35.6 36.3* 34.7 34.2  RDW 13.5 14.3 16.1* 16.6*    Chemistries   Recent Labs Lab 09/19/16 0433  09/20/16 0631 09/21/16 7846 09/22/16 0430 09/24/16 2039 09/25/16 0542  NA  --   --   --  135 134* 133*  K  --   --   --  3.2* 3.9 4.0  CL  --   --   --  105 102 103  CO2  --   --   --  GLUCOSE  --   --   --  104* 136* 97  BUN  --   --   --  CREATININE  --   --   --  <0.30* 0.40* 0.42*  CALCIUM  --   --   --  7.8* 8.3* 7.9*  MG  --   --   --   --  1.8  --   AST 502* 545* 709* 479* 185*  --   ALT 827* 876* 1,190* 1,024* 581*  --   ALKPHOS 195* 235* 256* 216* 291*  --   BILITOT 11.8* 14.3* 18.1* 18.1* 16.8*  --    ------------------------------------------------------------------------------------------------------------------ estimated creatinine clearance is 38.8 mL/min (A) (by C-G formula based on SCr of 0.42 mg/dL (L)). ------------------------------------------------------------------------------------------------------------------ No results for input(s): HGBA1C in the last 72 hours. ------------------------------------------------------------------------------------------------------------------ No results for input(s): CHOL, HDL, LDLCALC, TRIG, CHOLHDL, LDLDIRECT in the last 72 hours. ------------------------------------------------------------------------------------------------------------------ No results for input(s): TSH, T4TOTAL, T3FREE, THYROIDAB in the last 72 hours.  Invalid input(s): FREET3 ------------------------------------------------------------------------------------------------------------------ No results for input(s): VITAMINB12, FOLATE, FERRITIN, TIBC, IRON, RETICCTPCT in the last 72 hours.  Coagulation profile  Recent Labs Lab 09/21/16 0833 09/22/16 0430  INR 1.07 1.06    No results for input(s): DDIMER in the last 72 hours.  Cardiac Enzymes No results for input(s): CKMB, TROPONINI, MYOGLOBIN in the last 168 hours.  Invalid input(s):  CK ------------------------------------------------------------------------------------------------------------------ Invalid input(s): POCBNP   CBG: No results for input(s): GLUCAP in the last 168 hours.     Studies: No results found.    No results found for: HGBA1C Lab Results  Component Value Date   CREATININE 0.42 (L) 09/25/2016       Scheduled Meds: . enoxaparin (LOVENOX) injection  40 mg Subcutaneous Q24H  . metoprolol tartrate  25 mg Oral BID   Continuous Infusions:   LOS: 1 day    Time spent: >30 MINS    Richarda Overlie  [facility] Pager [phone]. If 7PM-7AM, please contact night-coverage at [email or web address], password [facility] 09/25/2016, 9:47 AM  LOS: 1 day

## 2016-09-26 ENCOUNTER — Encounter (HOSPITAL_COMMUNITY): Payer: Self-pay | Admitting: *Deleted

## 2016-09-26 DIAGNOSIS — R4189 Other symptoms and signs involving cognitive functions and awareness: Secondary | ICD-10-CM

## 2016-09-26 DIAGNOSIS — E46 Unspecified protein-calorie malnutrition: Secondary | ICD-10-CM | POA: Diagnosis present

## 2016-09-26 LAB — PROTIME-INR
INR: 0.96
Prothrombin Time: 12.7 seconds (ref 11.4–15.2)

## 2016-09-26 LAB — COMPREHENSIVE METABOLIC PANEL
ALT: 369 U/L — AB (ref 14–54)
AST: 115 U/L — AB (ref 15–41)
Albumin: 2 g/dL — ABNORMAL LOW (ref 3.5–5.0)
Alkaline Phosphatase: 251 U/L — ABNORMAL HIGH (ref 38–126)
Anion gap: 9 (ref 5–15)
BUN: 8 mg/dL (ref 6–20)
CHLORIDE: 102 mmol/L (ref 101–111)
CO2: 25 mmol/L (ref 22–32)
CREATININE: 0.48 mg/dL (ref 0.44–1.00)
Calcium: 7.9 mg/dL — ABNORMAL LOW (ref 8.9–10.3)
GFR calc Af Amer: >60 mL/min (ref >60)
GFR calc non Af Amer: >60 mL/min (ref >60)
Glucose, Bld: 88 mg/dL (ref 65–99)
Potassium: 3.7 mmol/L (ref 3.5–5.1)
SODIUM: 136 mmol/L (ref 135–145)
Total Bilirubin: 10.6 mg/dL — ABNORMAL HIGH (ref 0.3–1.2)
Total Protein: 4.4 g/dL — ABNORMAL LOW (ref 6.5–8.1)

## 2016-09-26 LAB — AMMONIA: Ammonia: 21 umol/L (ref 9–35)

## 2016-09-26 NOTE — NC FL2 (Deleted)
  [place] MEDICAID FL2 LEVEL OF CARE SCREENING TOOL     IDENTIFICATION  Patient Name: Lisa [name] Birthdate: 12/28/1923 Sex: female Admission Date (Current Location): 09/24/2016  [facility] and [place] Number:  [employer] and Address:  [facility],  [address] [place] [address]      Provider Number: [id number]  Attending Physician Name and Address:  Hollice Espy, MD  Relative Name and Phone Number:       Current Level of Care: Hospital Recommended Level of Care: Skilled Nursing Facility Prior Approval Number:    Date Approved/Denied:   PASRR Number:    Discharge Plan: SNF    Current Diagnoses: Patient Active Problem List   Diagnosis Date Noted  . Protein calorie malnutrition (HCC) 09/26/2016  . Elevated liver enzymes 09/24/2016  . HTN (hypertension) 09/24/2016  . Pressure injury of skin 09/21/2016  . Elevated LFTs 09/17/2016  . FTT (failure to thrive) in adult 09/17/2016  . Abdominal pain 09/17/2016  . Transaminitis   . Weakness   . CHF (congestive heart failure) (HCC)   . Mental retardation     Orientation RESPIRATION BLADDER Height & Weight     Self, Place  Normal Incontinent Weight: 117 lb 3.2 oz (53.2 kg) Height:   (162.6 cm)  BEHAVIORAL SYMPTOMS/MOOD NEUROLOGICAL BOWEL NUTRITION STATUS      Continent Diet (Regular)  AMBULATORY STATUS COMMUNICATION OF NEEDS Skin   Extensive Assist Verbally PU Stage and Appropriate Care (Mid sacrum) PU Stage 1 Dressing:  (Foam Dressing, PRN)                     Personal Care Assistance Level of Assistance  Bathing, Feeding, Dressing Bathing Assistance: Limited assistance Feeding assistance: Limited assistance Dressing Assistance: Limited assistance     Functional Limitations Info  Sight, Speech, Hearing Sight Info: Adequate Hearing Info: Adequate Speech Info: Adequate    SPECIAL CARE FACTORS FREQUENCY  PT (By licensed PT)     PT Frequency: 5x/week               Contractures Contractures Info: Not present    Additional Factors Info  Code Status, Allergies Code Status Info: DNR Allergies Info: Advil, Aspirin, Bactrim, Naprosyn            Current Medications (09/26/2016):  This is the current hospital active medication list Current Facility-Administered Medications  Medication Dose Route Frequency Provider Last Rate Last Dose  . enoxaparin (LOVENOX) injection 40 mg  40 mg Subcutaneous Q24H Haydee Salter, MD   40 mg at 09/25/16 2050  . hydrALAZINE (APRESOLINE) injection 10 mg  10 mg Intravenous Q8H PRN Haydee Salter, MD      . metoprolol tartrate (LOPRESSOR) tablet 25 mg  25 mg Oral BID Haydee Salter, MD   25 mg at 09/26/16 0951  . ondansetron (ZOFRAN) tablet 4 mg  4 mg Oral Q6H PRN Haydee Salter, MD       Or  . ondansetron [facility]) injection 4 mg  4 mg Intravenous Q6H PRN Haydee Salter, MD         Discharge Medications: Please see discharge summary for a list of discharge medications.  Relevant Imaging Results:  Relevant Lab Results:   Additional Information SSN [id number] Juleen China, LCSW

## 2016-09-26 NOTE — Evaluation (Signed)
Physical Therapy Evaluation Patient Details Name: Lisa [name] MRN: [id number] DOB: Sep 24, 1923 Today's Date: 09/26/2016   History of Present Illness  Lisa [name] is a 83yo woman who comes to [facility] on 9/8 due to weakness, transfered to [facility] for procedure, and returns to [facility] on 9/15. PMH: heart failure, HTN, mild intellectual disability, osteoporosis, and OA. Pt was being followed inititally for elevated liver enzymes, which are now improved, pt however remains with jaundice. Pt resides in group home/ALF.   Clinical Impression  Pt admitted with above diagnosis. Pt currently with functional limitations due to the deficits listed below (see "PT Problem List"). Upon entry, the patient is received semirecumbent in bed, no family/caregiver present. The pt is awake and agreeable to participate. No acute distress noted at this time. The pt is alert and oriented to self, pleasant, conversational, and following simple commands consistently, does not seem to be aware that her gown and bed are soaked in urine. Functional mobility assessment demonstrates adequate strength for independent bed mobility, but  demonstrative of acute weakness upon attempted transfers, now requiring max-total A for standing pivot transfers, met with high levels of anxiety and BLE buckling during attempt. P tis unable to perform and ambulation due to said weakness, which is significantly impaired compared to  Her baseline, wherein she AMB ad lib with rollator. She endorses some baseline vertigo, but does not complain of any this session. Pt will benefit from skilled PT intervention to increase independence and safety with basic mobility in preparation for discharge to the venue listed below.       Follow Up Recommendations SNF    Equipment Recommendations  None recommended by PT    Recommendations for Other Services       Precautions / Restrictions Precautions Precautions: Fall Restrictions Weight Bearing Restrictions: No       Mobility  Bed Mobility Overal bed mobility: Independent                Transfers Overall transfer level: Needs assistance   Transfers: Sit to/from Stand;Stand Pivot Transfers Sit to Stand: Min assist (performed 6x fro therex ) Stand pivot transfers: Max assist;+2 physical assistance       General transfer comment: unable to pick up feet in stance; stand pivot transfer resultant in high level anxiety and loss of support.   Ambulation/Gait Ambulation/Gait assistance: Independent (unable at this time dueto highlevel weakness)              Stairs            Wheelchair Mobility    Modified Rankin (Stroke Patients Only)       Balance Overall balance assessment: Needs assistance Sitting-balance support: Single extremity supported;Feet unsupported Sitting balance-Leahy Scale: Good     Standing balance support: Bilateral upper extremity supported;During functional activity Standing balance-Leahy Scale: Poor                               Pertinent Vitals/Pain Pain Assessment: No/denies pain    Home Living Family/patient expects to be discharged to:: Assisted living               Home Equipment: Walker - 4 wheels Additional Comments: pt unable to provide much detail here.     Prior Function Level of Independence: Independent with assistive device(s)               Hand Dominance        Extremity/Trunk Assessment  Upper Extremity Assessment Upper Extremity Assessment: Generalized weakness    Lower Extremity Assessment Lower Extremity Assessment: Generalized weakness    Cervical / Trunk Assessment Cervical / Trunk Assessment: Kyphotic (heavy kyphosis withou tsufficicent cervical lordosis to allow forward gaze. )  Communication      Cognition Arousal/Alertness: Awake/alert Behavior During Therapy: WFL for tasks assessed/performed Overall Cognitive Status: Within Functional Limits for tasks assessed                                  General Comments: hx of mild intellectual disability.       General Comments      Exercises     Assessment/Plan    PT Assessment Patient needs continued PT services  PT Problem List Decreased strength;Decreased activity tolerance;Decreased balance;Decreased mobility       PT Treatment Interventions Gait training;Functional mobility training;Therapeutic activities;Therapeutic exercise;Balance training;Patient/family education    PT Goals (Current goals can be found in the Care Plan section)  Acute Rehab PT Goals Patient Stated Goal: regain strength for basic mobility  PT Goal Formulation: With patient Time For Goal Achievement: 10/10/16 Potential to Achieve Goals: Good    Frequency Min 2X/week   Barriers to discharge Decreased caregiver support      Co-evaluation               AM-PAC PT "6 Clicks" Daily Activity  Outcome Measure Difficulty turning over in bed (including adjusting bedclothes, sheets and blankets)?: None Difficulty moving from lying on back to sitting on the side of the bed? : A Little Difficulty sitting down on and standing up from a chair with arms (e.g., wheelchair, bedside commode, etc,.)?: A Lot Help needed moving to and from a bed to chair (including a wheelchair)?: Total Help needed walking in hospital room?: Total Help needed climbing 3-5 steps with a railing? : Total 6 Click Score: 12    End of Session Equipment Utilized During Treatment: Gait belt Activity Tolerance: Patient tolerated treatment well;Patient limited by fatigue Patient left: in chair;with call bell/phone within reach;Other (comment) (MD in room, NA made aware pt is wet with urine ) Nurse Communication: Mobility status PT Visit Diagnosis: Unsteadiness on feet (R26.81);Muscle weakness (generalized) (M62.81);Difficulty in walking, not elsewhere classified (R26.2)    Time: 3790-2409 PT Time Calculation (min) (ACUTE ONLY): 15 min   Charges:    PT Evaluation $PT Eval Moderate Complexity: 1 Mod PT Treatments $Therapeutic Activity: 8-22 mins   PT G Codes:       12:35 PM, 10/02/16 Etta Grandchild, PT, DPT Physical Therapist - [place] [phone] (Office)    Buccola,Allan C 10/02/16, 12:29 PM

## 2016-09-26 NOTE — NC FL2 (Signed)
  [place] MEDICAID FL2 LEVEL OF CARE SCREENING TOOL     IDENTIFICATION  Patient Name: Lisa [name] Birthdate: [birth year] Sex: female Admission Date (Current Location): 09/24/2016  [facility] and [place] Number:  [place] [phone]) Facility and Address:  [facility],  [address] [place] [address]      Provider Number: [id number]  Attending Physician Name and Address:  Hollice Espy, MD  Relative Name and Phone Number:       Current Level of Care: Hospital Recommended Level of Care: Skilled Nursing Facility Prior Approval Number:    Date Approved/Denied:   PASRR Number:    Discharge Plan: SNF    Current Diagnoses: Patient Active Problem List   Diagnosis Date Noted  . Protein calorie malnutrition (HCC) 09/26/2016  . Elevated liver enzymes 09/24/2016  . HTN (hypertension) 09/24/2016  . Pressure injury of skin 09/21/2016  . Elevated LFTs 09/17/2016  . FTT (failure to thrive) in adult 09/17/2016  . Abdominal pain 09/17/2016  . Transaminitis   . Weakness   . CHF (congestive heart failure) (HCC)   . Mental retardation     Orientation RESPIRATION BLADDER Height & Weight     Self, Place  Normal Incontinent Weight: 117 lb 3.2 oz (53.2 kg) Height:   (162.6 cm)  BEHAVIORAL SYMPTOMS/MOOD NEUROLOGICAL BOWEL NUTRITION STATUS      Continent Diet (Regular)  AMBULATORY STATUS COMMUNICATION OF NEEDS Skin   Extensive Assist Verbally PU Stage and Appropriate Care (Mid sacrum) PU Stage 1 Dressing:  (Foam Dressing, PRN)                     Personal Care Assistance Level of Assistance  Bathing, Feeding, Dressing Bathing Assistance: Limited assistance Feeding assistance: Limited assistance Dressing Assistance: Limited assistance     Functional Limitations Info  Sight, Speech, Hearing Sight Info: Adequate Hearing Info: Adequate Speech Info: Adequate    SPECIAL CARE FACTORS FREQUENCY  PT (By licensed PT)     PT  Frequency: 5x/week              Contractures Contractures Info: Not present    Additional Factors Info  Code Status, Allergies Code Status Info: DNR Allergies Info: Advil, Aspirin, Bactrim, Naprosyn            Current Medications (09/26/2016):  This is the current hospital active medication list Current Facility-Administered Medications  Medication Dose Route Frequency Provider Last Rate Last Dose  . enoxaparin (LOVENOX) injection 40 mg  40 mg Subcutaneous Q24H Haydee Salter, MD   40 mg at 09/25/16 2050  . hydrALAZINE (APRESOLINE) injection 10 mg  10 mg Intravenous Q8H PRN Haydee Salter, MD      . metoprolol tartrate (LOPRESSOR) tablet 25 mg  25 mg Oral BID Haydee Salter, MD   25 mg at 09/26/16 0951  . ondansetron (ZOFRAN) tablet 4 mg  4 mg Oral Q6H PRN Haydee Salter, MD       Or  . ondansetron [facility]) injection 4 mg  4 mg Intravenous Q6H PRN Haydee Salter, MD         Discharge Medications: Please see discharge summary for a list of discharge medications.  Relevant Imaging Results:  Relevant Lab Results:   Additional Information SSN [id number] Juleen China, LCSW

## 2016-09-26 NOTE — Progress Notes (Addendum)
PROGRESS NOTE  Lisa [name] AOZ:[id number] DOB: February 27, 1923 DOA: 09/24/2016 PCP: System, Pcp Not In  HPI/Recap of past 41 hours:  83 year old female past medical history of cognitive impairment, heart failure and hypertension admitted to [facility] for hyperbilirubinemia and transaminitis. She was transferred to [facility] hospital for further evaluation and it was determined that this was drug induced, she was transferred back. Felt to be secondary to recent Bactrim use causing transaminitis. Over the past few days, liver enzymes have been slowly trending downward. Patient evaluated by physical therapy this morning who recommended that she go to a higher level of care (currently at an assisted living facility)  Assessment/Plan: Principal Problem:   Elevated liver enzymes/transaminitis/hyperbilirubinemia: Felt to be drug induced, specifically recent course of Bactrim. AST and ALT continue to trend downward, ALT today at 369, AST at 1:15. Active Problems:   Weakness: We'll need short-term skilled nursing   CHF (congestive heart failure) (HCC): Currently stable. Not on diuretics. Restarting home medications. Her weight has been stable during this hospitalization.   HTN (hypertension): Home medications being restarted Protein calorie malnutrition: Noted albumin of 2.0. We'll have nutrition see. History of cognitive deficit. We'll check ammonia level just to ensure there is no minimal encephalopathy in the background which may be contributing to her weakness   Code Status: DNR   Family Communication:  Left message for family  Disposition Plan: Likely skilled nursing next 24 hours    Consultants:  [facility]   Procedures:  None   Antimicrobials:  None   DVT prophylaxis:  Lovenox   Objective: Vitals:   09/25/16 1414 09/25/16 2047 09/26/16 0503 09/26/16 1112  BP: (!) 157/48 (!) 161/58 (!) 150/71   Pulse: 74 92 79   Resp: Temp: 98.2 F (36.8  C) 98.6 F (37 C) (!) 97.5 F (36.4 C)   TempSrc: Oral Oral Oral   SpO2: 99% 100% 98% 91%  Weight:   53.2 kg (117 lb 3.2 oz)   Height:        Intake/Output Summary (Last 24 hours) at 09/26/16 1326 Last data filed at 09/26/16 0953  Gross per 24 hour  Intake             1080 ml  Output             1750 ml  Net             -670 ml   Filed Weights   09/24/16 1859 09/25/16 0433 09/26/16 0503  Weight: 53.7 kg (118 lb 6.4 oz) 53.7 kg (118 lb 4.8 oz) 53.2 kg (117 lb 3.2 oz)    Exam:   General:  Alert and oriented 2, no acute distress  HEENT: Normocephalic, atraumatic, mucous membranes are slightly dry  Neck: Supple, no JVD   Cardiovascular: Regular rate and rhythm, S1-S2, 2/6 systolic ejection murmur   Respiratory: Clear to auscultation bilaterally   Abdomen: Soft, nontender, nondistended, positive bowel sounds   Musculoskeletal: No clubbing cyanosis, trace pitting edema bilaterally   Skin: Jaundiced  Psychiatry: Patient appropriate, some mild underlying dementia  Neuro: No focal deficits   Data Reviewed: CBC:  Recent Labs Lab 09/22/16 0430 09/24/16 2039 09/25/16 0542  WBC 8.7 6.6 5.5  HGB 11.8* 11.8* 11.0*  HCT 32.5* 34.0* 32.2*  MCV 90.8 94.2 93.9  PLT 378 458* 424*   Basic Metabolic Panel:  Recent Labs Lab 09/22/16 0430 09/24/16 2039 09/25/16 0542 09/26/16 0638  NA 135 134* 133* 136  K 3.2* 3.9 4.0 3.7  CL 105 102 103 102  CO2 GLUCOSE 104* 136* 97 88  BUN CREATININE <0.30* 0.40* 0.42* 0.48  CALCIUM 7.8* 8.3* 7.9* 7.9*  MG  --  1.8  --   --   PHOS  --  2.4*  --   --    GFR: Estimated Creatinine Clearance: 38.5 mL/min (by C-G formula based on SCr of 0.48 mg/dL). Liver Function Tests:  Recent Labs Lab 09/21/16 0833 09/22/16 0430 09/24/16 2039 09/25/16 0542 09/26/16 0638  AST 709* 479* 185* 148* 115*  ALT 1,190* 1,024* 581* 474* 369*  ALKPHOS 256* 216* 291* 239* 251*  BILITOT 18.1* 18.1* 16.8* 13.5* 10.6*    PROT 4.7* 4.2* 5.3* 4.5* 4.4*  ALBUMIN 2.3* 2.0* 2.3* 2.0* 2.0*    Recent Labs Lab 09/24/16 2039  LIPASE 136*   No results for input(s): AMMONIA in the last 168 hours. Coagulation Profile:  Recent Labs Lab 09/21/16 0833 09/22/16 0430 09/25/16 0542 09/26/16 0638  INR 1.07 1.06 1.03 0.96   Cardiac Enzymes: No results for input(s): CKTOTAL, CKMB, CKMBINDEX, TROPONINI in the last 168 hours. BNP (last 3 results) No results for input(s): PROBNP in the last 8760 hours. HbA1C: No results for input(s): HGBA1C in the last 72 hours. CBG: No results for input(s): GLUCAP in the last 168 hours. Lipid Profile: No results for input(s): CHOL, HDL, LDLCALC, TRIG, CHOLHDL, LDLDIRECT in the last 72 hours. Thyroid Function Tests: No results for input(s): TSH, T4TOTAL, FREET4, T3FREE, THYROIDAB in the last 72 hours. Anemia Panel: No results for input(s): VITAMINB12, FOLATE, FERRITIN, TIBC, IRON, RETICCTPCT in the last 72 hours. Urine analysis:    Component Value Date/Time   COLORURINE YELLOW 09/16/2016 1006   APPEARANCEUR CLEAR 09/16/2016 1006   LABSPEC 1.005 09/16/2016 1006   PHURINE 6.0 09/16/2016 1006   GLUCOSEU NEGATIVE 09/16/2016 1006   HGBUR NEGATIVE 09/16/2016 1006   BILIRUBINUR NEGATIVE 09/16/2016 1006   KETONESUR NEGATIVE 09/16/2016 1006   PROTEINUR NEGATIVE 09/16/2016 1006   UROBILINOGEN 0.2 06/10/2010 1717   NITRITE NEGATIVE 09/16/2016 1006   LEUKOCYTESUR NEGATIVE 09/16/2016 1006   Sepsis Labs: (procalcitonin:4,lacticidven:4)  ) Recent Results (from the past 240 hour(s))  MRSA PCR Screening     Status: None   Collection Time: 09/17/16  2:20 AM  Result Value Ref Range Status   MRSA by PCR NEGATIVE NEGATIVE Final    Comment:        The GeneXpert MRSA Assay (FDA approved for NASAL specimens only), is one component of a comprehensive MRSA colonization surveillance program. It is not intended to diagnose MRSA infection nor to guide or monitor treatment  for MRSA infections.       Studies: No results found.  Scheduled Meds: . enoxaparin (LOVENOX) injection  40 mg Subcutaneous Q24H  . metoprolol tartrate  25 mg Oral BID    Continuous Infusions:   LOS: 2 days     Hollice Espy, MD [facility] Pager [phone]  If 7PM-7AM, please contact night-coverage [email or web address] Password [email or web address] 09/26/2016, 1:26 PM
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
## 2016-09-26 NOTE — Clinical Social Work Placement (Signed)
   CLINICAL SOCIAL WORK PLACEMENT  NOTE  Date:  09/26/2016  Patient Details  Name: Lisa [name] MRN: [id number] Date of Birth: 04-02-1923  Clinical Social Work is seeking post-discharge placement for this patient at the Skilled  Nursing Facility level of care (*CSW will initial, date and re-position this form in  chart as items are completed):  Yes   Patient/family provided with [facility] Clinical Social Work Department's list of facilities offering this level of care within the geographic area requested by the patient (or if unable, by the patient's family).  Yes   Patient/family informed of their freedom to choose among providers that offer the needed level of care, that participate in Medicare, Medicaid or managed care program needed by the patient, have an available bed and are willing to accept the patient.  Yes   Patient/family informed of [facility]'s ownership interest in [facility] and [facility], as well as of the fact that they are under no obligation to receive care at these facilities.  PASRR submitted to EDS on 09/26/16     PASRR number received on       Existing PASRR number confirmed on       FL2 transmitted to all facilities in geographic area requested by pt/family on 09/26/16     FL2 transmitted to all facilities within larger geographic area on       Patient informed that his/her managed care company has contracts with or will negotiate with certain facilities, including the following:            Patient/family informed of bed offers received.  Patient chooses bed at       Physician recommends and patient chooses bed at      Patient to be transferred to   on  .  Patient to be transferred to facility by       Patient family notified on   of transfer.  Name of family member notified:        PHYSICIAN       Additional Comment:    _______________________________________________ Annice Needy, LCSW 09/26/2016, 3:20 PM

## 2016-09-27 DIAGNOSIS — I509 Heart failure, unspecified: Secondary | ICD-10-CM

## 2016-09-27 LAB — COMPREHENSIVE METABOLIC PANEL
ALT: 345 U/L — AB (ref 14–54)
AST: 131 U/L — AB (ref 15–41)
Albumin: 2.1 g/dL — ABNORMAL LOW (ref 3.5–5.0)
Alkaline Phosphatase: 242 U/L — ABNORMAL HIGH (ref 38–126)
Anion gap: 6 (ref 5–15)
BILIRUBIN TOTAL: 9 mg/dL — AB (ref 0.3–1.2)
BUN: 7 mg/dL (ref 6–20)
CO2: 27 mmol/L (ref 22–32)
Calcium: 7.8 mg/dL — ABNORMAL LOW (ref 8.9–10.3)
Chloride: 101 mmol/L (ref 101–111)
Creatinine, Ser: 0.57 mg/dL (ref 0.44–1.00)
GFR calc Af Amer: >60 mL/min (ref >60)
Glucose, Bld: 93 mg/dL (ref 65–99)
Potassium: 3.5 mmol/L (ref 3.5–5.1)
Sodium: 134 mmol/L — ABNORMAL LOW (ref 135–145)
TOTAL PROTEIN: 4.4 g/dL — AB (ref 6.5–8.1)

## 2016-09-27 LAB — PROTIME-INR
INR: 0.92
Prothrombin Time: 12.3 seconds (ref 11.4–15.2)

## 2016-09-27 NOTE — Clinical Social Work Note (Signed)
PASARR number obtained [id number]  (09/27/16-10/27/16)     Latoyna Hird, Juleen China, LCSW

## 2016-09-27 NOTE — Progress Notes (Signed)
Pt discharged to [facility] [place] today per Dr. Susie Cassette. Pt's IV site D/C'd and WDL. Pt's VSS. Report called to Marcelino Duster, nurse at [facility]. Currently awaiting EMS arrival. Pt in stable condition.

## 2016-09-27 NOTE — Care Management Important Message (Signed)
Important Message  Patient Details  Name: Lisa [name] MRN: [id number] Date of Birth: Nov 02, 1923   Medicare Important Message Given:  Yes    Lorilynn Lehr, Chrystine Oiler, RN 09/27/2016, 9:09 AM

## 2016-09-27 NOTE — Clinical Social Work Placement (Signed)
   CLINICAL SOCIAL WORK PLACEMENT  NOTE  Date:  09/27/2016  Patient Details  Name: Lisa [name] MRN: [id number] Date of Birth: 1923/06/16  Clinical Social Work is seeking post-discharge placement for this patient at the Skilled  Nursing Facility level of care (*CSW will initial, date and re-position this form in  chart as items are completed):  Yes   Patient/family provided with [facility] Clinical Social Work Department's list of facilities offering this level of care within the geographic area requested by the patient (or if unable, by the patient's family).  Yes   Patient/family informed of their freedom to choose among providers that offer the needed level of care, that participate in Medicare, Medicaid or managed care program needed by the patient, have an available bed and are willing to accept the patient.  Yes   Patient/family informed of [facility]'s ownership interest in [facility] and [facility], as well as of the fact that they are under no obligation to receive care at these facilities.  PASRR submitted to EDS on 09/26/16     PASRR number received on 09/27/16     Existing PASRR number confirmed on       FL2 transmitted to all facilities in geographic area requested by pt/family on 09/26/16     FL2 transmitted to all facilities within larger geographic area on       Patient informed that his/her managed care company has contracts with or will negotiate with certain facilities, including the following:        Yes   Patient/family informed of bed offers received.  Patient chooses bed at [facility]     Physician recommends and patient chooses bed at      Patient to be transferred to [facility] on 09/27/16.  Patient to be transferred to facility by [facility]     Patient family notified on 09/27/16 of transfer.  Name of family member notified:  Tana Conch, relative     PHYSICIAN       Additional Comment:  Thayer Ohm at [facility]  notified. Clinicals sent.   LCSW signing off.   _______________________________________________ Annice Needy, LCSW 09/27/2016, 1:14 PM

## 2016-09-27 NOTE — Discharge Summary (Signed)
Physician Discharge Summary  Lisa [name] MRN: [id number] DOB/AGE: [birth year] 83 y.o.  PCP: System, Pcp Not In   Admit date: 09/24/2016 Discharge date: 09/27/2016  Discharge Diagnoses:    Principal Problem:   Elevated liver enzymes Active Problems:   Weakness   CHF (congestive heart failure) (HCC)   HTN (hypertension)   Protein calorie malnutrition (HCC)    Follow-up recommendations Follow-up with PCP in 3-5 days , including all  additional recommended appointments as below Follow-up CBC, CMP in 3-5 days       Current Discharge Medication List    CONTINUE these medications which have NOT CHANGED   Details  metoprolol tartrate (LOPRESSOR) 25 MG tablet Take 25 mg by mouth 2 (two) times daily.     pantoprazole (PROTONIX) 40 MG tablet Take 40 mg by mouth daily.          Discharge Condition:Overall prognosis is okay  Discharge Instructions Get Medicines reviewed and adjusted: Please take all your medications with you for your next visit with your Primary MD  Please request your Primary MD to go over all hospital tests and procedure/radiological results at the follow up, please ask your Primary MD to get all Hospital records sent to his/her office.  If you experience worsening of your admission symptoms, develop shortness of breath, life threatening emergency, suicidal or homicidal thoughts you must seek medical attention immediately by calling 911 or calling your MD immediately if symptoms less severe.  You must read complete instructions/literature along with all the possible adverse reactions/side effects for all the Medicines you take and that have been prescribed to you. Take any new Medicines after you have completely understood and accpet all the possible adverse reactions/side effects.   Do not drive when taking Pain medications.   Do not take more than prescribed Pain, Sleep and Anxiety Medications  Special Instructions: If you have smoked or chewed  Tobacco in the last 2 yrs please stop smoking, stop any regular Alcohol and or any Recreational drug use.  Wear Seat belts while driving.  Please note  You were cared for by a hospitalist during your hospital stay. Once you are discharged, your primary care physician will handle any further medical issues. Please note that NO REFILLS for any discharge medications will be authorized once you are discharged, as it is imperative that you return to your primary care physician (or establish a relationship with a primary care physician if you do not have one) for your aftercare needs so that they can reassess your need for medications and monitor your lab values.  Discharge Instructions    Diet - low sodium heart healthy    Complete by:  As directed    Increase activity slowly    Complete by:  As directed        Allergies  Allergen Reactions  . Advil [Ibuprofen]   . Aspirin   . Bactrim [Sulfamethoxazole-Trimethoprim]     Drug Induced Liver Injury  . Naprosyn [Naproxen]       Disposition: SNF   Consults:   None during this admission   Significant Diagnostic Studies:  Dg Pelvis 1-2 Views  Result Date: 09/16/2016 CLINICAL DATA:  Fall EXAM: PELVIS - 1-2 VIEW COMPARISON:  None. FINDINGS: Osteopenia. SI joint arthritis. Pubic symphysis and rami appear intact. Vascular calcifications. Advanced arthritis at the right hip with chronic deformity of the right femoral head. Possible step-off deformity at the right femoral head neck junction. Moderate degenerative changes of the left hip IMPRESSION: 1. Advanced  arthritis of the right hip with chronic deformity of the right femoral head. Questionable step-off deformity at the right femoral head neck junction, suggest CT for further evaluation. Electronically Signed   By: Donavan Foil M.D.   On: 09/16/2016 19:39   Dg Tibia/fibula Right  Result Date: 09/16/2016 CLINICAL DATA:  Initial evaluation for acute trauma, fall. EXAM: RIGHT TIBIA AND  FIBULA - 2 VIEW COMPARISON:  None. FINDINGS: No acute fracture or dislocation. Bones are diffusely osteopenic. Degenerative changes noted about the knee and ankle. No acute soft tissue abnormality. Prominent vascular calcifications. IMPRESSION: No acute osseous abnormality about the right tibia/fibula. Electronically Signed   By: Jeannine Boga M.D.   On: 09/16/2016 19:55   Ct Head Wo Contrast  Result Date: 09/16/2016 CLINICAL DATA:  Head trauma. [place] out of bed last night. Lightheadedness and dizziness. Initial encounter. EXAM: CT HEAD WITHOUT CONTRAST TECHNIQUE: Contiguous axial images were obtained from the base of the skull through the vertex without intravenous contrast. COMPARISON:  None. FINDINGS: Brain: A 4 mm hyperdense focus in the right corona radiata has attenuation consistent with calcification. There is no evidence of acute cortical infarct, intracranial hemorrhage, midline shift, or extra-axial fluid collection. Mild generalized cerebral atrophy is within normal limits for age. Periventricular white matter hypoattenuation is nonspecific but compatible with mild chronic small vessel ischemic disease. Lacunar infarcts are present in the basal ganglia bilaterally. There is an 8 mm focus of calcification over the left parietal convexity without mass effect on the underlying brain, possibly a calcified meningioma and not felt to be of any clinical significance. Vascular: Calcified atherosclerosis at the skullbase. No hyperdense vessel. Skull: No fracture. Sclerosis in the right greater sphenoid wing without aggressive features, possibly fibrous dysplasia. Sinuses/Orbits: Minimal scattered paranasal sinus mucosal thickening clear mastoid air cells. Bilateral cataract extraction. Other: None. IMPRESSION: 1. No evidence of acute intracranial hemorrhage. 2. Age indeterminate lacunar infarcts in the basal ganglia bilaterally. 3. Mild chronic small vessel ischemic disease. 4. Possible 8 mm left parietal  meningioma. Electronically Signed   By: Logan Bores M.D.   On: 09/16/2016 19:50   US Abdomen Complete  Result Date: 09/17/2016 CLINICAL DATA:  83 year old female with abdominal pain, transaminitis and elevated bilirubin EXAM: ABDOMEN ULTRASOUND COMPLETE COMPARISON:  CT scan of the abdomen and pelvis 09/16/2016 FINDINGS: Gallbladder: The gallbladder is partially decompressed. Mildly striated and thickened gallbladder wall measuring 3-4 mm in diameter. No evidence of cholelithiasis. There is trace pericholecystic fluid. Per the sonographer, the sonographic Percell Miller sign was negative. Common bile duct: Diameter: Within normal limits at 5-6 mm Liver: No focal lesion identified. Within normal limits in parenchymal echogenicity. Portal vein is patent on color Doppler imaging with normal direction of blood flow towards the liver. IVC: No abnormality visualized. Pancreas: Visualized portion unremarkable. Spleen: Size and appearance within normal limits. Right Kidney: Length: 10.0 cm. Mildly echogenic renal parenchyma with increased differentiation of the corticomedullary junction. Small anechoic cystic structure within imperceptible margin measuring 0.9 cm in the upper pole consistent with a simple cyst. Left Kidney: Length: 11.1 cm. Mildly echogenic renal parenchyma. Small anechoic cystic structure within imperceptible margin in the lower pole measures up to 1.2 cm consistent with a small simple cyst. Abdominal aorta: No aneurysm visualized. Other findings: None. IMPRESSION: 1. Nonspecific findings of gallbladder wall thickening and stripe a shin as well as trace pericholecystic fluid without associated cholelithiasis or gallbladder distension. No sonographic Percell Miller sign although this sign is less reliable in the elderly population. Differential considerations include  acalculous cholecystitis, and gallbladder wall thickening related to intrinsic liver disease or hypoalbuminemia. 2. Normal diameter of the common bile  duct. 3. Echogenic kidneys bilaterally suggests underlying medical renal disease. 4. Bilateral simple renal cysts. Electronically Signed   By: Jacqulynn Cadet M.D.   On: 09/17/2016 10:16   Ct Abdomen Pelvis W Contrast  Result Date: 09/17/2016 CLINICAL DATA:  Patient fell off of the head. Patient was seen this morning for a fall as well. Unspecified abdominal pain. EXAM: CT ABDOMEN AND PELVIS WITH CONTRAST TECHNIQUE: Multidetector CT imaging of the abdomen and pelvis was performed using the standard protocol following bolus administration of intravenous contrast. CONTRAST:  7m ISOVUE-300 IOPAMIDOL (ISOVUE-300) INJECTION 61% COMPARISON:  CT right hip 09/16/2016 FINDINGS: Lower chest: Motion artifact limits evaluation. Probable atelectasis and scarring in the lung bases. Calcification of the aorta, mitral valve, and coronary arteries. Hepatobiliary: No focal liver abnormality is seen. No gallstones, gallbladder wall thickening, or biliary dilatation. Pancreas: Unremarkable. No pancreatic ductal dilatation or surrounding inflammatory changes. Spleen: Normal in size without focal abnormality. Adrenals/Urinary Tract: No adrenal gland nodules. Subcentimeter cysts in the kidneys. No hydronephrosis. Nephrograms are symmetrical. Bladder is moderately distended without wall thickening. Stomach/Bowel: Stomach, small bowel, and colon are mostly decompressed. Scattered stool in the colon. Appendix is not identified. Vascular/Lymphatic: Aortic atherosclerosis. No enlarged abdominal or pelvic lymph nodes. Prominent calcification at the origin of the celiac axis, renal artery origins, origin of the right iliac artery, and in the proximal/mid superior mesenteric artery. Vascular stenosis is likely. Vessels are patent. Reproductive: Uterus is not enlarged. Bilobed cystic structure demonstrated in the left adnexal region probably representing ovarian cyst. This measures about 2.1 x 3.9 cm in diameter. Ultrasound follow-up at  6-12 months is recommended. Other: No free air or free fluid in the abdomen. Abdominal wall musculature appears intact. Fatty atrophy of the gluteal muscles and paraspinal muscles. Infiltration in the subcutaneous fat over the left hip probably represents soft tissue contusion. This could represent a lipoma. Musculoskeletal: Degenerative changes throughout the lumbar spine. Mild anterior subluxation of L4 on L5 is likely degenerative. Prominent degenerative changes in the hips. No acute fractures are suggested. IMPRESSION: 1. No definite acute process demonstrated in the abdomen or pelvis. No evidence of bowel obstruction or inflammation. 2. Diffuse aortic atherosclerosis. Prominent calcification and multiple branch vessels may result in stenosis. Vessels appear patent. Coronary artery calcification. 3. Left adnexal cyst measuring 3.9 cm maximal diameter. Ultrasound follow-up at 6-12 months is recommended. 4. Degenerative changes in the lumbar spine and hips. Electronically Signed   By: WLucienne CapersM.D.   On: 09/17/2016 00:00   Ct Hip Right Wo Contrast  Result Date: 09/16/2016 CLINICAL DATA:  Right hip pain after fall. EXAM: CT OF THE RIGHT HIP WITHOUT CONTRAST TECHNIQUE: Multidetector CT imaging of the right hip was performed according to the standard protocol. Multiplanar CT image reconstructions were also generated. COMPARISON:  Radiograph earlier this day. FINDINGS: Bones/Joint/Cartilage No acute fracture. Advanced degenerative change of the right hip with complete joint space loss, flattening of femoral head and subchondral cystic change and scleroses. Large femoral head neck osteophytes creating the appearance step-off on radiograph. No evidence to suggest underlying avascular necrosis. The pubic rami are intact. Included right iliac bone and sacrum are intact. No hip joint effusion. Ligaments Suboptimally assessed by CT. Muscles and Tendons Evidence intramuscular hematoma. Soft tissues Faint soft  tissue edema posteriorly without confluent hematoma. Advanced atherosclerosis. Large stool burden in the ascending colon is incidentally noted. IMPRESSION:  Advanced degenerative change of the right hip without acute fracture. Electronically Signed   By: Jeb Levering M.D.   On: 09/16/2016 22:30   Mr Abdomen Mrcp Wo Contrast  Result Date: 09/18/2016 CLINICAL DATA:  Patient with abnormal LFTs. Jaundice. Abdominal pain. EXAM: MRI ABDOMEN WITHOUT CONTRAST  (INCLUDING MRCP) TECHNIQUE: Multiplanar multisequence MR imaging of the abdomen was performed. Heavily T2-weighted images of the biliary and pancreatic ducts were obtained, and three-dimensional MRCP images were rendered by post processing. COMPARISON:  CT abdomen pelvis 09/16/2016; abdominal ultrasound 09/17/2016. FINDINGS: Markedly limited exam due to motion artifact and patient's inability to breath hold. Lower chest: Small bilateral pleural effusions. Probable dependent atelectasis bilateral lower lobes. Cardiomegaly. Hepatobiliary: The liver is normal in size and contour. There is wall thickening of the gallbladder measuring up to 4 mm. Small amount of pericholecystic fluid. No definite filling defects identified within the gallbladder lumen. No definite intrahepatic or extrahepatic biliary ductal dilatation. Common bile duct measures 4 mm. MRCP images nondiagnostic due to motion artifact. Pancreas:  Unremarkable Spleen:  Unremarkable Adrenals/Urinary Tract: Normal adrenal glands. Kidneys are symmetric in size. No hydronephrosis. 10 mm cyst interpolar region right kidney. 10 mm cyst interpolar region left kidney. Stomach/Bowel: Visualized large and small bowel is unremarkable. No evidence for bowel obstruction. Vascular/Lymphatic: Normal caliber abdominal aorta. No retroperitoneal lymphadenopathy. Other:  There is a 4.1 cm left adnexal cystic lesion. Musculoskeletal: Mild third spacing of fluid. No aggressive or acute appearing osseous lesions. IMPRESSION:  Markedly limited exam secondary to motion artifact from patient's inability to breath hold. There is mild gallbladder wall thickening and pericholecystic fluid. The few visualized segments of the common bile duct are normal in diameter. No definite intraluminal filling defect identified although the majority of the common bile duct is not adequately assessed due to motion artifact. Findings are nonspecific however acalculous cholecystitis is a consideration. 4.1 cm left adnexal cystic lesion. In the nonacute setting, recommend further evaluation with pelvic ultrasound. These results were called by telephone at the time of interpretation on 09/18/2016 at 12:29 pm to Dr. Hildred Laser , who verbally acknowledged these results. Electronically Signed   By: Lovey Newcomer M.D.   On: 09/18/2016 12:36   Mr 3d Recon At Scanner  Result Date: 09/18/2016 CLINICAL DATA:  Patient with abnormal LFTs. Jaundice. Abdominal pain. EXAM: MRI ABDOMEN WITHOUT CONTRAST  (INCLUDING MRCP) TECHNIQUE: Multiplanar multisequence MR imaging of the abdomen was performed. Heavily T2-weighted images of the biliary and pancreatic ducts were obtained, and three-dimensional MRCP images were rendered by post processing. COMPARISON:  CT abdomen pelvis 09/16/2016; abdominal ultrasound 09/17/2016. FINDINGS: Markedly limited exam due to motion artifact and patient's inability to breath hold. Lower chest: Small bilateral pleural effusions. Probable dependent atelectasis bilateral lower lobes. Cardiomegaly. Hepatobiliary: The liver is normal in size and contour. There is wall thickening of the gallbladder measuring up to 4 mm. Small amount of pericholecystic fluid. No definite filling defects identified within the gallbladder lumen. No definite intrahepatic or extrahepatic biliary ductal dilatation. Common bile duct measures 4 mm. MRCP images nondiagnostic due to motion artifact. Pancreas:  Unremarkable Spleen:  Unremarkable Adrenals/Urinary Tract: Normal  adrenal glands. Kidneys are symmetric in size. No hydronephrosis. 10 mm cyst interpolar region right kidney. 10 mm cyst interpolar region left kidney. Stomach/Bowel: Visualized large and small bowel is unremarkable. No evidence for bowel obstruction. Vascular/Lymphatic: Normal caliber abdominal aorta. No retroperitoneal lymphadenopathy. Other:  There is a 4.1 cm left adnexal cystic lesion. Musculoskeletal: Mild third spacing of fluid. No aggressive  or acute appearing osseous lesions. IMPRESSION: Markedly limited exam secondary to motion artifact from patient's inability to breath hold. There is mild gallbladder wall thickening and pericholecystic fluid. The few visualized segments of the common bile duct are normal in diameter. No definite intraluminal filling defect identified although the majority of the common bile duct is not adequately assessed due to motion artifact. Findings are nonspecific however acalculous cholecystitis is a consideration. 4.1 cm left adnexal cystic lesion. In the nonacute setting, recommend further evaluation with pelvic ultrasound. These results were called by telephone at the time of interpretation on 09/18/2016 at 12:29 pm to Dr. Hildred Laser , who verbally acknowledged these results. Electronically Signed   By: Lovey Newcomer M.D.   On: 09/18/2016 12:36   US Abdomen Limited Ruq  Result Date: 09/19/2016 CLINICAL DATA:  Jaundice, increasing bilirubin since prior ultrasound, nondiagnostic MRCP EXAM: ULTRASOUND ABDOMEN LIMITED RIGHT UPPER QUADRANT COMPARISON:  09/17/2016 FINDINGS: Gallbladder: Persistent significant gallbladder wall thickening. Gallbladder less distended than on previous exam. Internal scattered echoes/debris. No definite shadowing calculi or sonographic Murphy sign. Common bile duct: Diameter: 5 mm diameter, previously 6 mm Liver: No focal lesion identified. Within normal limits in parenchymal echogenicity. Portal vein is patent on color Doppler imaging with normal  direction of blood flow towards the liver. No RIGHT upper quadrant free fluid. Incidentally noted pancreatic duct 3 mm diameter upper normal. Incidentally noted tiny RIGHT renal cyst 11 mm diameter. IMPRESSION: Persistent significant gallbladder wall thickening without definite shadowing calculi or sonographic Murphy sign. Stable normal caliber CBD 5 mm diameter. Electronically Signed   By: Lavonia Dana M.D.   On: 09/19/2016 11:08       Filed Weights   09/25/16 0433 09/26/16 0503 09/27/16 0500  Weight: 53.7 kg (118 lb 4.8 oz) 53.2 kg (117 lb 3.2 oz) 52.8 kg (116 lb 6.4 oz)     Microbiology: No results found for this or any previous visit (from the past 240 hour(s)).     Blood Culture    Component Value Date/Time   SDES URINE, CLEAN CATCH 06/10/2010 1717   SPECREQUEST NONE 06/10/2010 1717   CULT NO GROWTH 06/10/2010 1717   REPTSTATUS 06/11/2010 FINAL 06/10/2010 1717      Labs: Results for orders placed or performed during the hospital encounter of 09/24/16 (from the past 48 hour(s))  Protime-INR     Status: None   Collection Time: 09/26/16  6:38 AM  Result Value Ref Range   Prothrombin Time 12.7 11.4 - 15.2 seconds   INR 0.96   Comprehensive metabolic panel     Status: Abnormal   Collection Time: 09/26/16  6:38 AM  Result Value Ref Range   Sodium 136 135 - 145 mmol/L   Potassium 3.7 3.5 - 5.1 mmol/L   Chloride 102 101 - 111 mmol/L   CO2 25 22 - 32 mmol/L   Glucose, Bld 88 65 - 99 mg/dL   BUN 8 6 - 20 mg/dL   Creatinine, Ser 0.48 0.44 - 1.00 mg/dL   Calcium 7.9 (L) 8.9 - 10.3 mg/dL   Total Protein 4.4 (L) 6.5 - 8.1 g/dL   Albumin 2.0 (L) 3.5 - 5.0 g/dL   AST 115 (H) 15 - 41 U/L   ALT 369 (H) 14 - 54 U/L   Alkaline Phosphatase 251 (H) 38 - 126 U/L   Total Bilirubin 10.6 (H) 0.3 - 1.2 mg/dL   GFR calc non Af Amer >60 >60 mL/min   GFR calc Af Amer >60 >60  mL/min    Comment: (NOTE) The eGFR has been calculated using the CKD EPI equation. This calculation has not been  validated in all clinical situations. eGFR's persistently <60 mL/min signify possible Chronic Kidney Disease.    Anion gap 9 5 - 15  Ammonia     Status: None   Collection Time: 09/26/16  1:47 PM  Result Value Ref Range   Ammonia 21 9 - 35 umol/L  Protime-INR     Status: None   Collection Time: 09/27/16  5:58 AM  Result Value Ref Range   Prothrombin Time 12.3 11.4 - 15.2 seconds   INR 0.92   Comprehensive metabolic panel     Status: Abnormal   Collection Time: 09/27/16  5:58 AM  Result Value Ref Range   Sodium 134 (L) 135 - 145 mmol/L   Potassium 3.5 3.5 - 5.1 mmol/L   Chloride 101 101 - 111 mmol/L   CO2 27 22 - 32 mmol/L   Glucose, Bld 93 65 - 99 mg/dL   BUN 7 6 - 20 mg/dL   Creatinine, Ser 0.57 0.44 - 1.00 mg/dL   Calcium 7.8 (L) 8.9 - 10.3 mg/dL   Total Protein 4.4 (L) 6.5 - 8.1 g/dL   Albumin 2.1 (L) 3.5 - 5.0 g/dL   AST 131 (H) 15 - 41 U/L   ALT 345 (H) 14 - 54 U/L   Alkaline Phosphatase 242 (H) 38 - 126 U/L   Total Bilirubin 9.0 (H) 0.3 - 1.2 mg/dL   GFR calc non Af Amer >60 >60 mL/min   GFR calc Af Amer >60 >60 mL/min    Comment: (NOTE) The eGFR has been calculated using the CKD EPI equation. This calculation has not been validated in all clinical situations. eGFR's persistently <60 mL/min signify possible Chronic Kidney Disease.    Anion gap 6 5 - 15     Lipid Panel  No results found for: CHOL, TRIG, HDL, CHOLHDL, VLDL, LDLCALC, LDLDIRECT   No results found for: HGBA1C   Lab Results  Component Value Date   CREATININE 0.57 09/27/2016     HPI :  Ms. Lisa [name] is a 83yo F w/ hx of cognitive impairment (lives in group home), CHF, and HTN who was admitted to   [facility] with hyperbilirubinemia and transaminitis c/f drug induced liver injury vs obstructive jaundice. Hepatology favored drug-induced liver enzyme elevation based on imaging and time course with recent Bactrim. She remains stable and liver function is improving. See below for  details  HOSPITAL COURSE:   Jaundice w/ transaminitis and elevated ALP likely due to drug-induced liver injury   initial LFTs showed elevated transaminases w/ ALT > 1000 and AST ~ 700 on previous admission. Presence of elevated ALP and T.Bili w/ predominant direct bilirubin points toward cholestasis. However both abdominal CT, RUQ [place] and MRCP  did not show any signs of biliary duct dilatation or biliary stones. Taking together, this findings indicate that intrahepatic cholestasis was present. The patient was on TMP-SMZ recently. 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 It is also possible that she had a stone that passed spontaneously. She has been hypothermic in the previous days, but now her temperatures have improved. Her temp is normal on admission. Would avoid bactrim in the future and list as allergy. AST/ALT/alkaline phosphatase and T bili continued to improve  Weakness-patient was found to need SNF based on physical therapy recommendations. Ammonia level was 21 this admission   Congestive heart failure, compensated  no echo on file. Patient was on lasix, metoprolol and benazepril at home. Started metoprolol 25 mg  . No lasix required since patient was not volume overloaded on exam and on room air currently. Low sodium diet.   HTN-currently controlled She was on amlodipine-benazepril and metoprolol at home. BP has been in the 120s/50s on admission, so we will hold home BP meds,continue  BB as above.   Protein calorie malnutrition-albumin found to be 2.1, recommend nutrition consult and follow-up at SNF   Discharge Exam:  Blood pressure (!) 127/58, pulse 77, temperature (!) 97.4 F (36.3 C), temperature source Oral, resp. rate 16, height 5' 4"  (1.626 m), weight 52.8 kg (116 lb 6.4 oz), SpO2 97 %.  General exam: Appears calm and comfortable  Respiratory system: Clear to auscultation. Respiratory effort normal. Cardiovascular system: S1 & S2 heard, RRR. No JVD,  murmurs, rubs, gallops or clicks. No pedal edema. Gastrointestinal system: Abdomen is nondistended, soft and nontender. No organomegaly or masses felt. Normal bowel sounds heard. Central nervous system slightly confused No focal neurological deficits.      SignedReyne Dumas 09/27/2016, 7:58 AM        Time spent >1 hour

## 2016-10-12 ENCOUNTER — Ambulatory Visit: Payer: Self-pay | Admitting: Physician Assistant

## 2016-11-01 ENCOUNTER — Ambulatory Visit: Payer: Medicare Other | Admitting: Orthopaedic Surgery

## 2017-04-10 DEATH — deceased

## 2018-05-29 IMAGING — MR MR 3D RECON AT SCANNER
13 series · 16 of 16 positions shown · non-contrast
Comparison: CT abdomen pelvis 09/16/2016; abdominal ultrasound
09/17/2016.

CLINICAL DATA: Patient with abnormal LFTs. Jaundice. Abdominal
pain.

EXAM:
MRI ABDOMEN WITHOUT CONTRAST  (INCLUDING MRCP)
TECHNIQUE: Multiplanar multisequence MR imaging of the abdomen was performed.
Heavily T2-weighted images of the biliary and pancreatic ducts were
obtained, and three-dimensional MRCP images were rendered by post
processing.

[Series 3: T2 · coronal · 5.0mm · 1.10mm/px · 1 of 36 slices shown (1 of 3)]
[im 1/36]
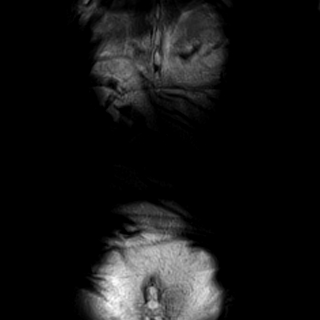

[Series 4: t2fs axial blade · axial · 4.0mm · 1.03mm/px · 1 of 48 slices shown]
[im 1/48]
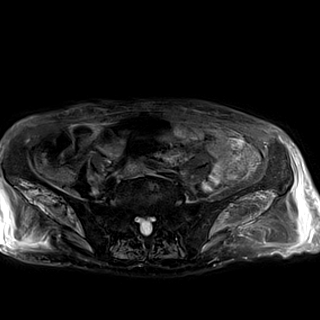

[Series 5: MRCP · coronal · 4.0mm · 0.78mm/px · 1 of 15 slices shown]
[im 1/15]
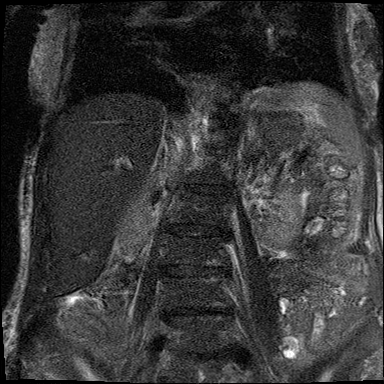

[Series 7: T2 · coronal · 1.0mm · 0.31mm/px · 2 of 104 slices shown (2 of 3)]
[im 1/104]
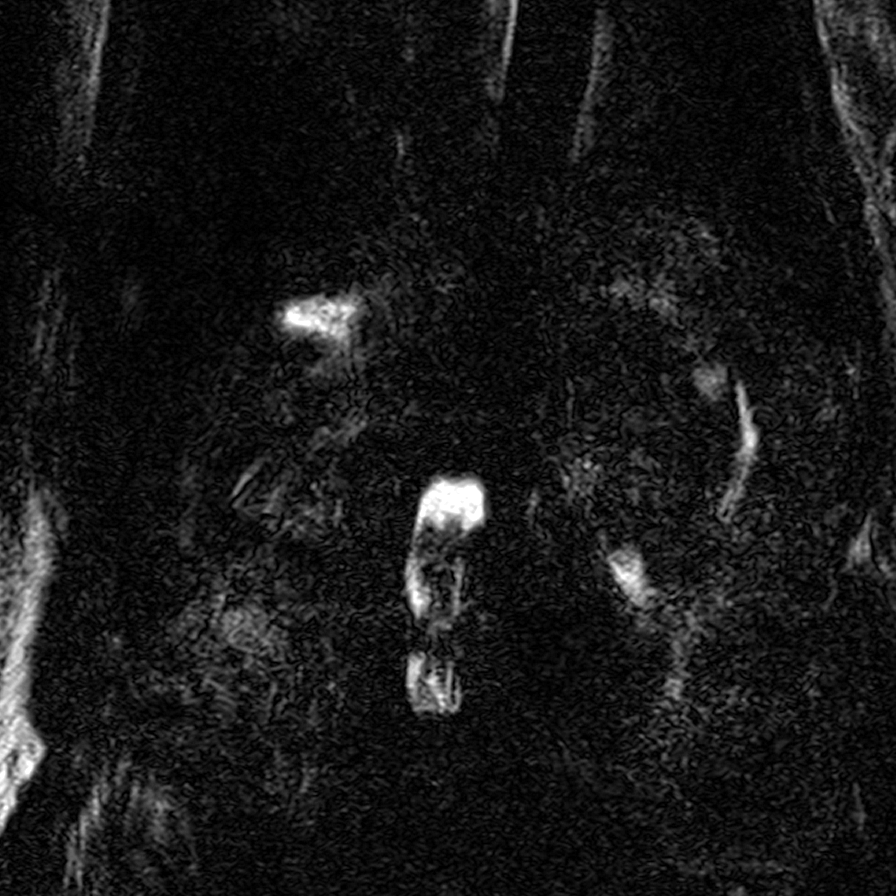
[im 104/104]
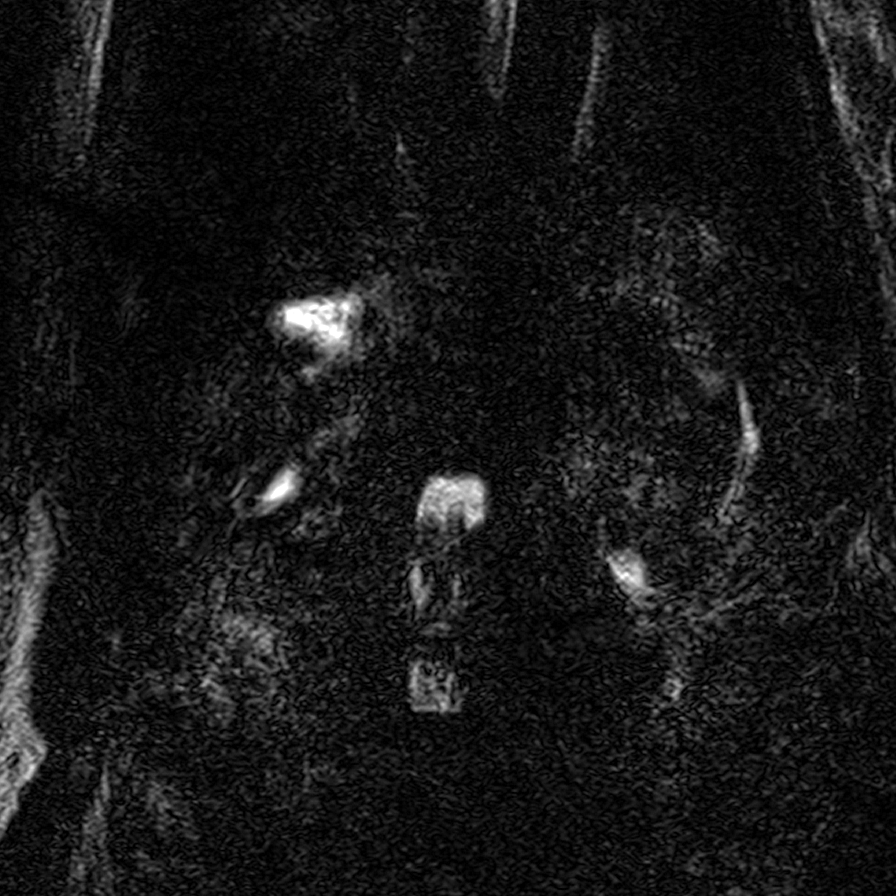

[Series 10: DWI · axial · 5.0mm · 0.82mm/px · z∈[-97,+137]mm · 2 of 80 slices shown (1 of 2)]
[im 1/80]
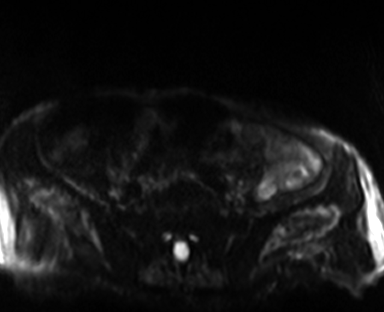
[im 80/80]
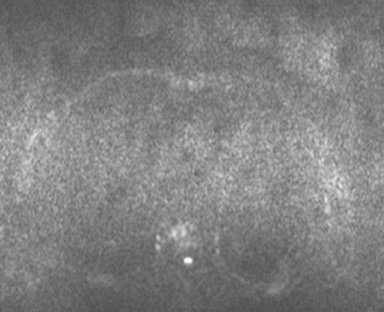

[Series 11: DWI · axial · 5.0mm · 0.82mm/px · 1 of 40 slices shown (2 of 2)]
[im 1/40]
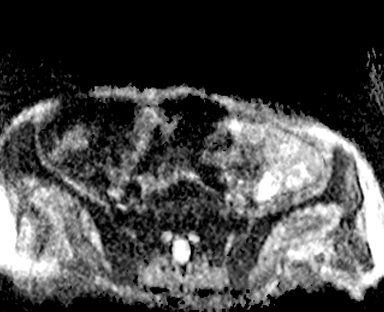

[Series 12: bSSFP · axial · 4.0mm · 1.14mm/px · z∈[-118,+158]mm · 2 of 70 slices shown]
[im 1/70]
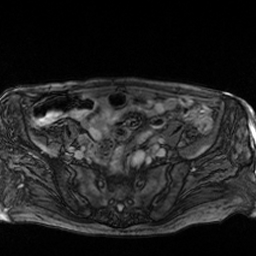
[im 70/70]
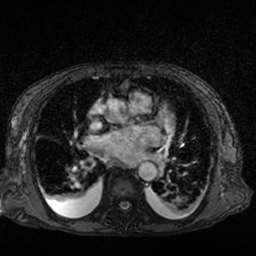

[Series 13: t1fs axial · axial · 5.0mm · 0.59mm/px · 1 of 38 slices shown]
[im 1/38]
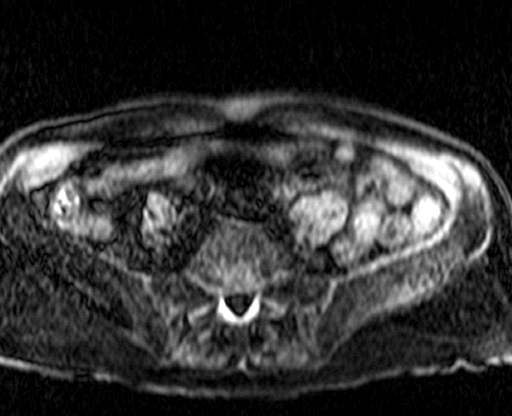

[Series 14: T1 · axial · 5.0mm · 0.61mm/px · 1 of 38 slices shown (1 of 2)]
[im 1/38]
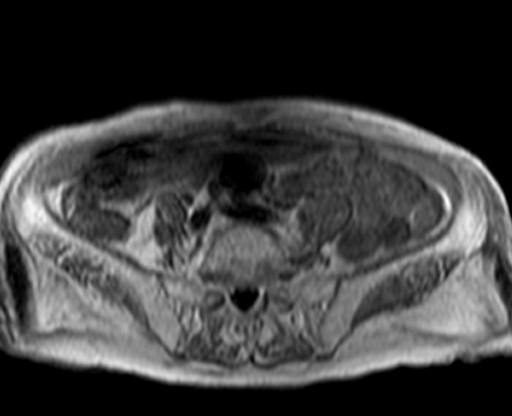

[Series 15: T1 · axial · 5.0mm · 0.63mm/px · 1 of 38 slices shown (2 of 2)]
[im 1/38]
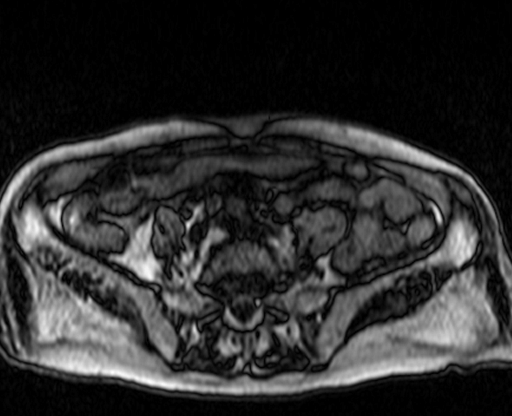

[Series 16: t1fs coronal · coronal · 5.0mm · 0.62mm/px · 1 of 30 slices shown]
[im 1/30]
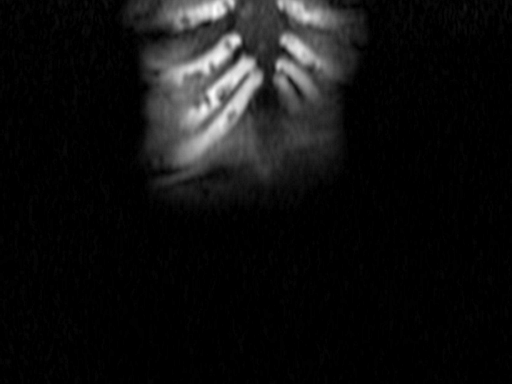

[Series 17: T2 · axial · 5.0mm · 1.23mm/px · 1 of 40 slices shown (3 of 3)]
[im 1/40]
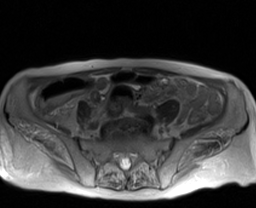

[Series 100: <mip range> · sagittal · 1.0mm · 0.27mm/px · 1 of 39 slices shown]
[im 1/39]
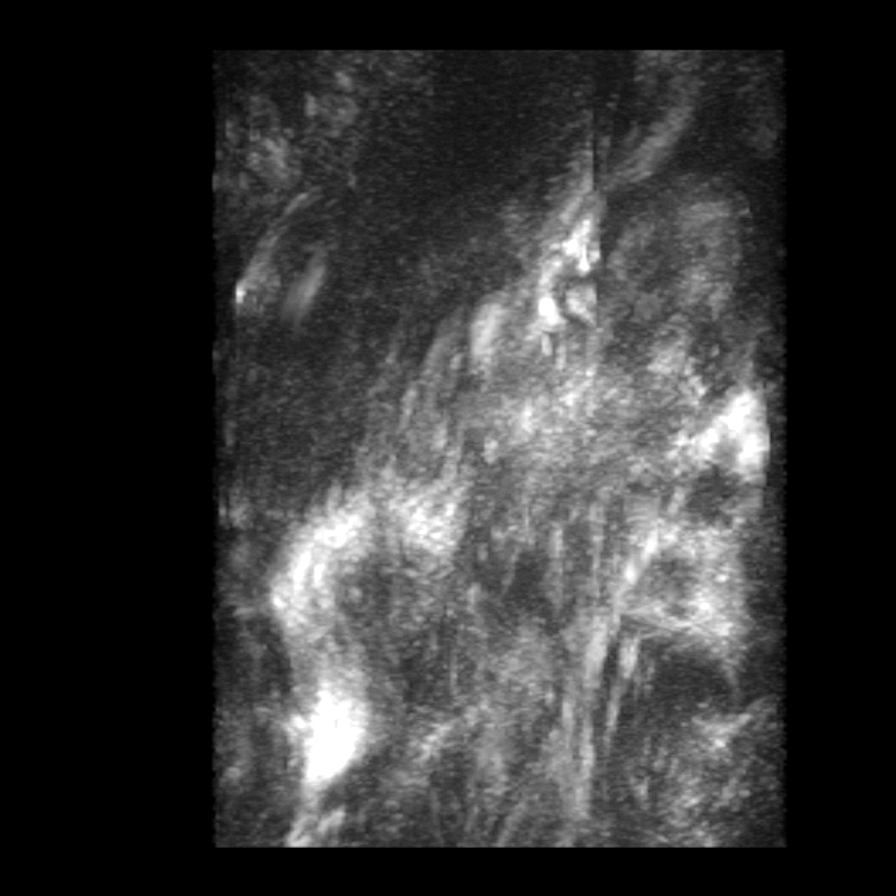

[16 of 16 positions shown; findings below may reference images not displayed]

FINDINGS: Markedly limited exam due to motion artifact and patient's inability
to breath hold.

Lower chest: Small bilateral pleural effusions. Probable dependent
atelectasis bilateral lower lobes. Cardiomegaly.

Hepatobiliary: The liver is normal in size and contour. There is
wall thickening of the gallbladder measuring up to 4 mm. Small
amount of pericholecystic fluid. No definite filling defects
identified within the gallbladder lumen. No definite intrahepatic or
extrahepatic biliary ductal dilatation. Common bile duct measures 4
mm. MRCP images nondiagnostic due to motion artifact.

Pancreas:  Unremarkable

Spleen:  Unremarkable

Adrenals/Urinary Tract: Normal adrenal glands. Kidneys are symmetric
in size. No hydronephrosis. 10 mm cyst interpolar region right
kidney. 10 mm cyst interpolar region left kidney.

Stomach/Bowel: Visualized large and small bowel is unremarkable. No
evidence for bowel obstruction.

Vascular/Lymphatic: Normal caliber abdominal aorta. No
retroperitoneal lymphadenopathy.

Other:  There is a 4.1 cm left adnexal cystic lesion.

Musculoskeletal: Mild third spacing of fluid. No aggressive or acute
appearing osseous lesions.
IMPRESSION: Markedly limited exam secondary to motion artifact from patient's
inability to breath hold.

There is mild gallbladder wall thickening and pericholecystic fluid.
The few visualized segments of the common bile duct are normal in
diameter. No definite intraluminal filling defect identified
although the majority of the common bile duct is not adequately
assessed due to motion artifact. Findings are nonspecific however
acalculous cholecystitis is a consideration.

4.1 cm left adnexal cystic lesion. In the nonacute setting,
recommend further evaluation with pelvic ultrasound.

These results were called by telephone at the time of interpretation
on 09/18/2016 at [DATE] to Dr. PALACINKARNICA FURHT , who verbally
acknowledged these results.
